# Patient Record
Sex: Male | Born: 1985 | Race: White | Hispanic: No | Marital: Single | Smoking: Current some day smoker
Health system: Southern US, Community
[De-identification: ages and names within clinical notes are randomized; demographics above are authoritative.]

## PROBLEM LIST (undated history)

## (undated) DIAGNOSIS — K429 Umbilical hernia without obstruction or gangrene: Secondary | ICD-10-CM

---

## 2011-09-19 ENCOUNTER — Encounter: Payer: Self-pay | Admitting: Internal Medicine

## 2011-10-05 ENCOUNTER — Ambulatory Visit: Payer: Self-pay | Admitting: Internal Medicine

## 2012-08-14 ENCOUNTER — Other Ambulatory Visit: Payer: Self-pay | Admitting: Gastroenterology

## 2012-08-14 DIAGNOSIS — R1033 Periumbilical pain: Secondary | ICD-10-CM

## 2012-08-14 DIAGNOSIS — K429 Umbilical hernia without obstruction or gangrene: Secondary | ICD-10-CM

## 2012-09-05 ENCOUNTER — Other Ambulatory Visit: Payer: Self-pay

## 2012-09-12 ENCOUNTER — Ambulatory Visit
Admission: RE | Admit: 2012-09-12 | Discharge: 2012-09-12 | Disposition: A | Discharge status: Home / Self Care | Payer: BC Managed Care – PPO | Source: Ambulatory Visit | Attending: Gastroenterology | Admitting: Gastroenterology

## 2012-09-12 DIAGNOSIS — R1033 Periumbilical pain: Secondary | ICD-10-CM

## 2012-09-12 DIAGNOSIS — K429 Umbilical hernia without obstruction or gangrene: Secondary | ICD-10-CM

## 2012-09-12 MED ORDER — IOHEXOL 300 MG/ML  SOLN
100.0000 mL | Freq: Once | INTRAMUSCULAR | Status: AC | PRN
  Administered 2012-09-12: 100 mL via INTRAVENOUS

## 2012-09-23 ENCOUNTER — Emergency Department (HOSPITAL_COMMUNITY): Payer: BC Managed Care – PPO

## 2012-09-23 ENCOUNTER — Emergency Department (HOSPITAL_COMMUNITY)
Admission: EM | Admit: 2012-09-23 | Discharge: 2012-09-23 | Disposition: A | Discharge status: Home / Self Care | Payer: BC Managed Care – PPO | Attending: Emergency Medicine | Admitting: Emergency Medicine

## 2012-09-23 ENCOUNTER — Encounter (HOSPITAL_COMMUNITY): Payer: Self-pay | Admitting: Family Medicine

## 2012-09-23 DIAGNOSIS — F1210 Cannabis abuse, uncomplicated: Secondary | ICD-10-CM | POA: Insufficient documentation

## 2012-09-23 DIAGNOSIS — Z79899 Other long term (current) drug therapy: Secondary | ICD-10-CM | POA: Insufficient documentation

## 2012-09-23 DIAGNOSIS — K429 Umbilical hernia without obstruction or gangrene: Secondary | ICD-10-CM | POA: Insufficient documentation

## 2012-09-23 DIAGNOSIS — R109 Unspecified abdominal pain: Secondary | ICD-10-CM

## 2012-09-23 HISTORY — DX: Umbilical hernia without obstruction or gangrene: K42.9

## 2012-09-23 LAB — CBC WITH DIFFERENTIAL/PLATELET
Eosinophils Absolute: 0.2 10*3/uL (ref 0.0–0.7)
Eosinophils Relative: 2 % (ref 0–5)
HCT: 45.2 % (ref 39.0–52.0)
Lymphocytes Relative: 24 % (ref 12–46)
Lymphs Abs: 2.5 10*3/uL (ref 0.7–4.0)
MCH: 31.6 pg (ref 26.0–34.0)
MCV: 91.7 fL (ref 78.0–100.0)
Monocytes Absolute: 0.9 10*3/uL (ref 0.1–1.0)
RBC: 4.93 MIL/uL (ref 4.22–5.81)
WBC: 10.3 10*3/uL (ref 4.0–10.5)

## 2012-09-23 LAB — URINALYSIS, ROUTINE W REFLEX MICROSCOPIC
Glucose, UA: NEGATIVE mg/dL
Ketones, ur: 15 mg/dL — AB
Leukocytes, UA: NEGATIVE
Protein, ur: NEGATIVE mg/dL
Urobilinogen, UA: 0.2 mg/dL (ref 0.0–1.0)

## 2012-09-23 LAB — COMPREHENSIVE METABOLIC PANEL
BUN: 14 mg/dL (ref 6–23)
CO2: 25 mEq/L (ref 19–32)
Calcium: 9.6 mg/dL (ref 8.4–10.5)
Creatinine, Ser: 0.98 mg/dL (ref 0.50–1.35)
GFR calc Af Amer: >90 mL/min (ref >90)
GFR calc non Af Amer: >90 mL/min (ref >90)
Glucose, Bld: 89 mg/dL (ref 70–99)

## 2012-09-23 LAB — LIPASE, BLOOD: Lipase: 27 U/L (ref 11–59)

## 2012-09-23 LAB — AMYLASE: Amylase: 52 U/L (ref 0–105)

## 2012-09-23 MED ORDER — HYDROCODONE-ACETAMINOPHEN 5-325 MG PO TABS: 1.0000 | ORAL_TABLET | ORAL | Status: DC | PRN

## 2012-09-23 MED ORDER — MORPHINE SULFATE 4 MG/ML IJ SOLN
4.0000 mg | Freq: Once | INTRAMUSCULAR | Status: AC
  Administered 2012-09-23: 4 mg via INTRAVENOUS
  Filled 2012-09-23: qty 1

## 2012-09-23 MED ORDER — SODIUM CHLORIDE 0.9 % IV SOLN
Freq: Once | INTRAVENOUS | Status: AC
  Administered 2012-09-23: 21:00:00 via INTRAVENOUS

## 2012-09-23 MED ORDER — ONDANSETRON HCL 4 MG/2ML IJ SOLN
4.0000 mg | Freq: Once | INTRAMUSCULAR | Status: AC
  Administered 2012-09-23: 4 mg via INTRAVENOUS
  Filled 2012-09-23: qty 2

## 2012-09-23 MED ORDER — METOCLOPRAMIDE HCL 5 MG/ML IJ SOLN
10.0000 mg | Freq: Once | INTRAMUSCULAR | Status: AC
  Administered 2012-09-23: 10 mg via INTRAVENOUS
  Filled 2012-09-23: qty 2

## 2012-09-23 NOTE — ED Provider Notes (Signed)
Medical screening examination/treatment/procedure(s) were conducted as a shared visit with non-physician practitioner(s) and myself.  I personally evaluated the patient during the encounter  Pt without signs of umbilical hernia incarceration. Patient abdomen is nonsurgical at this time. He stable for discharge  Toy Baker, MD 09/23/12 2225

## 2012-09-23 NOTE — ED Provider Notes (Signed)
History     CSN: [id number]  Arrival date & time 09/23/12  1754   First MD Initiated Contact with Patient 09/23/12 2012      Chief Complaint  Patient presents with  . Abdominal Pain   HPI  History provided by the patient and father. Patient is a 27 year old male with no significant PMH who presents with complaints of acute onset sharp stabbing pain to the umbilical area. Symptoms began around 4 PM towards end of his work day. 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 Patient does report prior history of chronic abdominal cramping, bloating and discomforts. Patient's sharp pain today is much different and more severe. It has been constant and unchanged. It is not worse with movements or position. Patient has not used any other treatments for his symptoms. Patient has been recently worked up for his chronic bloating and abdominal discomforts. He was seen by GI specialist, Dr. Loreta Ave reports she had several blood tests to rule out Crohn's disease, celiac disease and other possible causes. Patient also underwent a CT scan of his abdomen and pelvis on January 10 that was normal. Patient does state that he was told he may have a small umbilical hernia which is where his pain is coming from. He denies any diarrhea constipation today. Denies any blood in stool. Denies any nausea vomiting. Denies any fever, chills or sweats.    Past Medical History  Diagnosis Date  . Hernia, umbilical     No past surgical history on file.  No family history on file.  History  Substance Use Topics  . Smoking status: Never Smoker   . Smokeless tobacco: Not on file  . Alcohol Use: 12.0 oz/week    20 Cans of beer per week      Review of Systems  Constitutional: Negative for fever, chills and appetite change.  Gastrointestinal: Positive for abdominal pain. Negative for nausea, vomiting, diarrhea, constipation, blood in stool and anal bleeding.  Genitourinary: Negative for dysuria,  frequency, hematuria and flank pain.  All other systems reviewed and are negative.    Allergies  Review of patient's allergies indicates no known allergies.  Home Medications   Current Outpatient Rx  Name  Route  Sig  Dispense  Refill  . ACYCLOVIR 800 MG PO TABS   Oral   Take 800 mg by mouth daily.           BP 126/88  Pulse 97  Temp 98.4 F (36.9 C) (Oral)  Resp 18  SpO2 100%  Physical Exam  Nursing note and vitals reviewed. Constitutional: He is oriented to person, place, and time. He appears well-developed and well-nourished. No distress.  HENT:  Head: Normocephalic.  Cardiovascular: Normal rate and regular rhythm.   Pulmonary/Chest: Effort normal and breath sounds normal. No respiratory distress. He has no wheezes.  Abdominal: Soft. There is no rebound, no guarding, no tenderness at McBurney's point and negative Murphy's sign. A hernia is present.       Small reducible umbilical hernia  Neurological: He is alert and oriented to person, place, and time.  Skin: Skin is warm.  Psychiatric: He has a normal mood and affect. His behavior is normal.    ED Course  Procedures   Results for orders placed during the hospital encounter of 09/23/12  CBC WITH DIFFERENTIAL      Component Value Range   WBC 10.3  4.0 - 10.5 K/uL   RBC 4.93  4.22 - 5.81 MIL/uL  Hemoglobin 15.6  13.0 - 17.0 g/dL   HCT 40.9  81.1 - 91.4 %   MCV 91.7  78.0 - 100.0 fL   MCH 31.6  26.0 - 34.0 pg   MCHC 34.5  30.0 - 36.0 g/dL   RDW 78.2  95.6 - 21.3 %   Platelets 255  150 - 400 K/uL   Neutrophils Relative 65  43 - 77 %   Neutro Abs 6.7  1.7 - 7.7 K/uL   Lymphocytes Relative 24  12 - 46 %   Lymphs Abs 2.5  0.7 - 4.0 K/uL   Monocytes Relative 8  3 - 12 %   Monocytes Absolute 0.9  0.1 - 1.0 K/uL   Eosinophils Relative 2  0 - 5 %   Eosinophils Absolute 0.2  0.0 - 0.7 K/uL   Basophils Relative 1  0 - 1 %   Basophils Absolute 0.1  0.0 - 0.1 K/uL  COMPREHENSIVE METABOLIC PANEL      Component  Value Range   Sodium 137  135 - 145 mEq/L   Potassium 3.6  3.5 - 5.1 mEq/L   Chloride 101  96 - 112 mEq/L   CO2 25  19 - 32 mEq/L   Glucose, Bld 89  70 - 99 mg/dL   BUN 14  6 - 23 mg/dL   Creatinine, Ser 0.86  0.50 - 1.35 mg/dL   Calcium 9.6  8.4 - 57.8 mg/dL   Total Protein 7.7  6.0 - 8.3 g/dL   Albumin 4.4  3.5 - 5.2 g/dL   AST 12  0 - 37 U/L   ALT 15  0 - 53 U/L   Alkaline Phosphatase 43  39 - 117 U/L   Total Bilirubin 1.0  0.3 - 1.2 mg/dL   GFR calc non Af Amer >90  >90 mL/min   GFR calc Af Amer >90  >90 mL/min  AMYLASE      Component Value Range   Amylase 52  0 - 105 U/L  LIPASE, BLOOD      Component Value Range   Lipase 27  11 - 59 U/L  URINALYSIS, ROUTINE W REFLEX MICROSCOPIC      Component Value Range   Color, Urine YELLOW  YELLOW   APPearance CLEAR  CLEAR   Specific Gravity, Urine 1.020  1.005 - 1.030   pH 6.0  5.0 - 8.0   Glucose, UA NEGATIVE  NEGATIVE mg/dL   Hgb urine dipstick NEGATIVE  NEGATIVE   Bilirubin Urine NEGATIVE  NEGATIVE   Ketones, ur 15 (*) NEGATIVE mg/dL   Protein, ur NEGATIVE  NEGATIVE mg/dL   Urobilinogen, UA 0.2  0.0 - 1.0 mg/dL   Nitrite NEGATIVE  NEGATIVE   Leukocytes, UA NEGATIVE  NEGATIVE       Dg Abd Acute W/chest  09/23/2012  *RADIOLOGY REPORT*  Clinical Data: 27 year old male with abdominal pain.  ACUTE ABDOMEN SERIES (ABDOMEN 2 VIEW & CHEST 1 VIEW)  Comparison: 09/12/2012 CT  Findings: The cardiomediastinal silhouette is unremarkable.  There is no evidence of airspace disease, pleural effusion or pneumothorax.  The bowel gas pattern is unremarkable. There is no evidence of bowel obstruction or pneumoperitoneum. No suspicious calcifications are identified. No acute or suspicious bony abnormalities are present.  IMPRESSION: No evidence of acute abnormality - unremarkable bowel gas pattern.   Original Report Authenticated By: Harmon Pier, M.D.      1. Abdominal pain   2. Umbilical hernia       MDM  8:50 PM patient seen and  evaluated. Patient claimed that appears comfortable in no acute distress.  Patient has a very small 1-2 cm wide reducible umbilical hernia. There is tenderness to this area. No other significant abdominal tenderness. No peritoneal signs.   Patient with some improvement after medications. Patient was also seen and evaluated by attending physician and agrees with plan. Patient will be given prescription for [facility] and general surgery referral.     Angus Seller, PA 09/24/12 [phone]

## 2012-09-23 NOTE — ED Notes (Signed)
Patient states he has had "stabbing" abdominal pain since 4pm. States he has an umbilical hernia. Had a CT scan on 09/12/12. States pain got extremely worse today. Has never had this type of pain. Denies vomiting and diarrhea; reports some nausea.

## 2012-09-23 NOTE — ED Notes (Signed)
PA at bedside.

## 2012-09-25 NOTE — ED Provider Notes (Signed)
Medical screening examination/treatment/procedure(s) were performed by non-physician practitioner and as supervising physician I was immediately available for consultation/collaboration.  Toy Baker, MD 09/25/12 1328

## 2012-10-02 ENCOUNTER — Telehealth (INDEPENDENT_AMBULATORY_CARE_PROVIDER_SITE_OTHER): Payer: Self-pay

## 2012-10-02 NOTE — Telephone Encounter (Signed)
Called pt to let him know that his appt for Feb 10 has been r/s to Fri, Timothy Rivers 31st at 310p per MM request.  He appreciated my call and said he will be in the office at 245p.

## 2012-10-03 ENCOUNTER — Encounter (INDEPENDENT_AMBULATORY_CARE_PROVIDER_SITE_OTHER): Payer: Self-pay | Admitting: Surgery

## 2012-10-03 ENCOUNTER — Ambulatory Visit (INDEPENDENT_AMBULATORY_CARE_PROVIDER_SITE_OTHER): Payer: BC Managed Care – PPO | Admitting: Surgery

## 2012-10-03 VITALS — BP 142/84 | HR 76 | Temp 98.8°F | Resp 16 | Ht 72.0 in | Wt 180.0 lb

## 2012-10-03 DIAGNOSIS — R109 Unspecified abdominal pain: Secondary | ICD-10-CM

## 2012-10-03 MED ORDER — HYDROCODONE-ACETAMINOPHEN 5-325 MG PO TABS: 1.0000 | ORAL_TABLET | ORAL | Status: DC | PRN

## 2012-10-03 NOTE — Patient Instructions (Signed)
Followup with [facility] as needed

## 2012-10-03 NOTE — Progress Notes (Signed)
Chief complaint is abdominal pain of several years duration.  Timothy Rivers is a 26 year old white male who is referred by Dr. Loreta Ave for evaluation of abdominal pain. This has been accentuated and he had a sharp pain in his periumbilical region and was thought to have a significant umbilical hernia. CT scan was done which failed to reveal any cause for his pain and showed no evidence of a umbilical hernia. However he does have a tiny little umbilical hernia that I can squeeze and reduced. I would not let this kidney from working out and would probably where a weight belt when lifting.  More importantly he has the abdominal pain he has been may be related to meals it may be doesn't. One thing that I question his his chronic use of acyclovir and whether this antiviral agents could be contributing to his GI upset either by impacting his motility or with some direct inflammatory impacting the small bowel.  For the present time I am not recommending surgery for his umbilical hernia.  He made need upper endoscopy to look at his stomach and to look for evidence of gastric outlet obstruction and do a study for H. Pylori. I would be happy seen again as needed.  I did at his request fill a  prescription for hydrocodone 5/325   #30.

## 2012-10-06 ENCOUNTER — Ambulatory Visit (INDEPENDENT_AMBULATORY_CARE_PROVIDER_SITE_OTHER): Payer: BC Managed Care – PPO | Admitting: General Surgery

## 2012-10-08 ENCOUNTER — Encounter (INDEPENDENT_AMBULATORY_CARE_PROVIDER_SITE_OTHER): Payer: Self-pay

## 2012-10-15 ENCOUNTER — Ambulatory Visit (INDEPENDENT_AMBULATORY_CARE_PROVIDER_SITE_OTHER): Payer: Self-pay | Admitting: Surgery

## 2012-10-21 ENCOUNTER — Other Ambulatory Visit: Payer: Self-pay | Admitting: Gastroenterology

## 2012-10-21 DIAGNOSIS — R109 Unspecified abdominal pain: Secondary | ICD-10-CM

## 2012-11-07 ENCOUNTER — Ambulatory Visit (HOSPITAL_COMMUNITY): Payer: BC Managed Care – PPO

## 2012-11-07 ENCOUNTER — Encounter (HOSPITAL_COMMUNITY): Payer: BC Managed Care – PPO

## 2012-11-21 ENCOUNTER — Encounter (HOSPITAL_COMMUNITY)
Admission: RE | Admit: 2012-11-21 | Discharge: 2012-11-21 | Disposition: A | Discharge status: Home / Self Care | Payer: BC Managed Care – PPO | Source: Ambulatory Visit | Attending: Gastroenterology | Admitting: Gastroenterology

## 2012-11-21 ENCOUNTER — Ambulatory Visit (HOSPITAL_COMMUNITY)
Admission: RE | Admit: 2012-11-21 | Discharge: 2012-11-21 | Disposition: A | Discharge status: Home / Self Care | Payer: BC Managed Care – PPO | Source: Ambulatory Visit | Attending: Gastroenterology | Admitting: Gastroenterology

## 2012-11-21 DIAGNOSIS — R109 Unspecified abdominal pain: Secondary | ICD-10-CM | POA: Insufficient documentation

## 2012-12-12 ENCOUNTER — Encounter (INDEPENDENT_AMBULATORY_CARE_PROVIDER_SITE_OTHER): Payer: Self-pay

## 2012-12-23 ENCOUNTER — Other Ambulatory Visit: Payer: Self-pay | Admitting: Gastroenterology

## 2012-12-23 DIAGNOSIS — R1033 Periumbilical pain: Secondary | ICD-10-CM

## 2013-01-01 ENCOUNTER — Encounter (HOSPITAL_COMMUNITY)
Admission: RE | Admit: 2013-01-01 | Discharge: 2013-01-01 | Disposition: A | Discharge status: Home / Self Care | Payer: BC Managed Care – PPO | Source: Ambulatory Visit | Attending: Gastroenterology | Admitting: Gastroenterology

## 2013-01-01 DIAGNOSIS — R1033 Periumbilical pain: Secondary | ICD-10-CM | POA: Insufficient documentation

## 2013-01-01 MED ORDER — TECHNETIUM TC 99M SULFUR COLLOID
2.0000 | Freq: Once | INTRAVENOUS | Status: AC | PRN
  Administered 2013-01-01: 2 via ORAL

## 2013-08-17 ENCOUNTER — Other Ambulatory Visit: Payer: Self-pay | Admitting: Internal Medicine

## 2013-10-15 ENCOUNTER — Ambulatory Visit (INDEPENDENT_AMBULATORY_CARE_PROVIDER_SITE_OTHER): Payer: BC Managed Care – PPO | Admitting: Internal Medicine

## 2013-10-15 ENCOUNTER — Encounter: Payer: Self-pay | Admitting: Internal Medicine

## 2013-10-15 VITALS — BP 134/78 | HR 80 | Temp 99.7°F | Resp 16 | Ht 72.25 in | Wt 194.2 lb

## 2013-10-15 DIAGNOSIS — Z1212 Encounter for screening for malignant neoplasm of rectum: Secondary | ICD-10-CM

## 2013-10-15 DIAGNOSIS — R740 Nonspecific elevation of levels of transaminase and lactic acid dehydrogenase [LDH]: Secondary | ICD-10-CM

## 2013-10-15 DIAGNOSIS — Z111 Encounter for screening for respiratory tuberculosis: Secondary | ICD-10-CM

## 2013-10-15 DIAGNOSIS — R7402 Elevation of levels of lactic acid dehydrogenase (LDH): Secondary | ICD-10-CM

## 2013-10-15 DIAGNOSIS — E559 Vitamin D deficiency, unspecified: Secondary | ICD-10-CM

## 2013-10-15 DIAGNOSIS — Z0000 Encounter for general adult medical examination without abnormal findings: Secondary | ICD-10-CM

## 2013-10-15 DIAGNOSIS — Z79899 Other long term (current) drug therapy: Secondary | ICD-10-CM

## 2013-10-15 DIAGNOSIS — Z113 Encounter for screening for infections with a predominantly sexual mode of transmission: Secondary | ICD-10-CM

## 2013-10-15 LAB — CBC WITH DIFFERENTIAL/PLATELET
BASOS PCT: 1 % (ref 0–1)
Basophils Absolute: 0.1 10*3/uL (ref 0.0–0.1)
EOS ABS: 0.2 10*3/uL (ref 0.0–0.7)
EOS PCT: 3 % (ref 0–5)
HCT: 44.9 % (ref 39.0–52.0)
Hemoglobin: 15.6 g/dL (ref 13.0–17.0)
LYMPHS ABS: 2.2 10*3/uL (ref 0.7–4.0)
Lymphocytes Relative: 33 % (ref 12–46)
MCH: 31.9 pg (ref 26.0–34.0)
MCHC: 34.7 g/dL (ref 30.0–36.0)
MCV: 91.8 fL (ref 78.0–100.0)
Monocytes Absolute: 0.6 10*3/uL (ref 0.1–1.0)
Monocytes Relative: 9 % (ref 3–12)
NEUTROS PCT: 54 % (ref 43–77)
Neutro Abs: 3.4 10*3/uL (ref 1.7–7.7)
PLATELETS: 249 10*3/uL (ref 150–400)
RBC: 4.89 MIL/uL (ref 4.22–5.81)
RDW: 13.4 % (ref 11.5–15.5)
WBC: 6.5 10*3/uL (ref 4.0–10.5)

## 2013-10-15 LAB — HEMOGLOBIN A1C
HEMOGLOBIN A1C: 5.2 % (ref <5.7)
MEAN PLASMA GLUCOSE: 103 mg/dL (ref <117)

## 2013-10-15 NOTE — Progress Notes (Signed)
Patient ID: Timothy Rivers, male   DOB: 01/22/1986, 28 y.o.   MRN: [id number]   Annual Screening Comprehensive Examination   This very nice 28 y.o.male presents for complete physical. On one occasion during an acute illness in 2009 , he did have an elevated BP of 140/94, but BP's since including today's have been WNL. Last year in 2014 he did have an extensive w/u by Dr Collene Mares with Neg(-) EGD , neg U/S, and CT scans and negative lab screening including Celiac/Gluten testing. He describes ongoing problems with post prandial Bloating and ? mild abdominal distension. Otherwise, patient has no major health issues or concerns.    Finally, patient has history of Vitamin D Deficiency with vitamin D of 37 in 2008.       Medication List   acyclovir 800 MG tablet  Commonly known as:  ZOVIRAX  Take 800 mg by mouth daily.     HYDROcodone-acetaminophen 5-325 MG per tablet  Commonly known as:  NORCO  Take 1 tablet by mouth every 4 (four) hours as needed for pain.     valACYclovir 1000 MG tablet  Commonly known as:  VALTREX  TAKE 1/2 TABLET DAILY        No Known Allergies  Past Medical History  Diagnosis Date  . Hernia, umbilical     No past surgical history on file.  Family History  Problem Relation Age of Onset  . Diabetes Maternal Aunt   . Diabetes Maternal Uncle   . Cancer Maternal Grandfather     History   Social History  . Marital Status: Single    Spouse Name: N/A    Number of Children: N/A  . Years of Education: N/A   Occupational History  . Not on file.   Social History Main Topics  . Smoking status: Never Smoker   . Smokeless tobacco: Never Used  . Alcohol Use: 3.0 oz/week    5 Cans of beer per week  . Drug Use: Yes    Special: Marijuana  . Sexual Activity: Yes   Other Topics Concern  . Not on file   Social History Narrative  . No narrative on file    ROS Constitutional: Denies fever, chills, weight loss/gain, headaches, insomnia, fatigue, night sweats, and  change in appetite. Eyes: Denies redness, blurred vision, diplopia, discharge, itchy, watery eyes.  ENT: Denies discharge, congestion, post nasal drip, epistaxis, sore throat, earache, hearing loss, dental pain, Tinnitus, Vertigo, Sinus pain, snoring.  Cardio: Denies chest pain, palpitations, irregular heartbeat, syncope, dyspnea, diaphoresis, orthopnea, PND, claudication, edema Respiratory: denies cough, dyspnea, DOE, pleurisy, hoarseness, laryngitis, wheezing.  Gastrointestinal: Denies dysphagia, heartburn, reflux, water brash, pain, cramps, nausea, vomiting, bloating, diarrhea, constipation, hematemesis, melena, hematochezia, jaundice, hemorrhoids Genitourinary: Denies dysuria, frequency, urgency, nocturia, hesitancy, discharge, hematuria, flank pain Breast: Breast lumps, nipple discharge, bleeding.  Musculoskeletal: Denies arthralgia, myalgia, stiffness, Jt. Swelling, pain, limp, and strain/sprain. Does report occasional LBP which he attributes to his chair at work. Skin: Denies puritis, rash, hives, warts, acne, eczema, changing in skin lesion Neuro: Weakness, tremor, incoordination, spasms, paresthesia, pain Psychiatric: Denies confusion, memory loss, sensory loss. Endocrine: Denies change in weight, skin, hair change, nocturia, and paresthesia, diabetic polys, visual blurring, hyper /hypo glycemic episodes.  Heme/Lymph: No excessive bleeding, bruising, enlarged lymph nodes.  BP: 134/78  Pulse: 80  Temp: 99.7 F (37.6 C)  Resp: 16   Estimated body mass index is 26.16 kg/(m^2) as calculated from the following:   Height as of this encounter: 6' 0.25" (1.835  m).   Weight as of this encounter: 194 lb 3.2 oz (88.089 kg).  Physical Exam General Appearance: Well nourished, in no apparent distress. Eyes: PERRLA, EOMs, conjunctiva no swelling or erythema, normal fundi and vessels. Sinuses: No frontal/maxillary tenderness ENT/Mouth: EACs patent / TMs  nl. Nares clear without erythema,  swelling, mucoid exudates. Oral hygiene is good. No erythema, swelling, or exudate. Tongue normal, non-obstructing. Tonsils not swollen or erythematous. Hearing normal.  Neck: Supple, thyroid normal. No bruits, nodes or JVD. Respiratory: Respiratory effort normal.  BS equal and clear bilateral without rales, rhonci, wheezing or stridor. Cardio: Heart sounds are normal with regular rate and rhythm and no murmurs, rubs or gallops. Peripheral pulses are normal and equal bilaterally without edema. No aortic or femoral bruits. Chest: symmetric with normal excursions and percussion. Breasts: Symmetric, without lumps, nipple discharge, retractions, or fibrocystic changes.  Abdomen: Flat, soft, with bowl sounds. Nontender, no guarding, rebound, hernias, masses, or organomegaly.  Lymphatics: Non tender without lymphadenopathy.  Genitourinary:  Musculoskeletal: Full ROM all peripheral extremities, joint stability, 5/5 strength, and normal gait. Skin: Warm and dry without rashes, lesions, cyanosis, clubbing or  ecchymosis.  Neuro: Cranial nerves intact, reflexes equal bilaterally. Normal muscle tone, no cerebellar symptoms. Sensation intact.  Pysch: Awake and oriented X 3, normal affect, Insight and Judgment appropriate.   Assessment and Plan  1. Annual Screening Examination 2. Probable IBS 3. Vitamin D Deficiency   Continue prudent diet as discussed, weight control, regular exercise, and medications. Routine screening labs and tests as requested with regular follow-up as recommended.

## 2013-10-16 LAB — HEPATIC FUNCTION PANEL
ALK PHOS: 44 U/L (ref 39–117)
ALT: 22 U/L (ref 0–53)
AST: 14 U/L (ref 0–37)
Albumin: 4.4 g/dL (ref 3.5–5.2)
BILIRUBIN DIRECT: 0.2 mg/dL (ref 0.0–0.3)
BILIRUBIN INDIRECT: 0.7 mg/dL (ref 0.2–1.2)
BILIRUBIN TOTAL: 0.9 mg/dL (ref 0.2–1.2)
Total Protein: 7.4 g/dL (ref 6.0–8.3)

## 2013-10-16 LAB — MAGNESIUM: Magnesium: 1.8 mg/dL (ref 1.5–2.5)

## 2013-10-16 LAB — LIPID PANEL
CHOLESTEROL: 137 mg/dL (ref 0–200)
HDL: 57 mg/dL (ref >39)
LDL Cholesterol: 70 mg/dL (ref 0–99)
TRIGLYCERIDES: 51 mg/dL (ref <150)
Total CHOL/HDL Ratio: 2.4 Ratio
VLDL: 10 mg/dL (ref 0–40)

## 2013-10-16 LAB — HEPATITIS B SURFACE ANTIBODY,QUALITATIVE: Hep B S Ab: NEGATIVE

## 2013-10-16 LAB — BASIC METABOLIC PANEL WITH GFR
BUN: 12 mg/dL (ref 6–23)
CALCIUM: 9.7 mg/dL (ref 8.4–10.5)
CO2: 29 meq/L (ref 19–32)
CREATININE: 0.84 mg/dL (ref 0.50–1.35)
Chloride: 102 mEq/L (ref 96–112)
GFR, Est Non African American: >89 mL/min
GLUCOSE: 54 mg/dL — AB (ref 70–99)
Potassium: 3.9 mEq/L (ref 3.5–5.3)
SODIUM: 141 meq/L (ref 135–145)

## 2013-10-16 LAB — HEPATITIS B CORE ANTIBODY, TOTAL: HEP B C TOTAL AB: NONREACTIVE

## 2013-10-16 LAB — RPR

## 2013-10-16 LAB — MICROALBUMIN / CREATININE URINE RATIO
Creatinine, Urine: 59.9 mg/dL
Microalb Creat Ratio: 8.3 mg/g (ref 0.0–30.0)
Microalb, Ur: 0.5 mg/dL (ref 0.00–1.89)

## 2013-10-16 LAB — URINALYSIS, MICROSCOPIC ONLY
BACTERIA UA: NONE SEEN
CASTS: NONE SEEN
CRYSTALS: NONE SEEN
SQUAMOUS EPITHELIAL / LPF: NONE SEEN

## 2013-10-16 LAB — HEPATITIS A ANTIBODY, TOTAL: HEP A TOTAL AB: NONREACTIVE

## 2013-10-16 LAB — HEPATITIS C ANTIBODY: HCV Ab: NEGATIVE

## 2013-10-16 LAB — TESTOSTERONE: Testosterone: 420 ng/dL (ref 300–890)

## 2013-10-16 LAB — HIV ANTIBODY (ROUTINE TESTING W REFLEX): HIV: NONREACTIVE

## 2013-10-16 LAB — VITAMIN B12: VITAMIN B 12: 316 pg/mL (ref 211–911)

## 2013-10-16 LAB — TSH: TSH: 1.264 u[IU]/mL (ref 0.350–4.500)

## 2013-10-16 LAB — INSULIN, FASTING: Insulin fasting, serum: 9 u[IU]/mL (ref 3–28)

## 2013-10-16 LAB — VITAMIN D 25 HYDROXY (VIT D DEFICIENCY, FRACTURES): VIT D 25 HYDROXY: 26 ng/mL — AB (ref 30–89)

## 2013-10-19 ENCOUNTER — Other Ambulatory Visit: Payer: Self-pay | Admitting: Emergency Medicine

## 2013-10-19 LAB — TB SKIN TEST
INDURATION: 0 mm
TB SKIN TEST: NEGATIVE

## 2013-10-19 LAB — HEPATITIS B E ANTIBODY: HEPATITIS BE ANTIBODY: NEGATIVE

## 2013-10-19 MED ORDER — VALACYCLOVIR HCL 1 G PO TABS: ORAL_TABLET | ORAL | Status: DC

## 2013-11-15 IMAGING — CR DG ABDOMEN ACUTE W/ 1V CHEST
4 series · 4 of 4 positions shown · non-contrast
Comparison: 09/12/2012 CT

CLINICAL DATA: 26-year-old male with abdominal pain.

ACUTE ABDOMEN SERIES (ABDOMEN 2 VIEW & CHEST 1 VIEW)

[w chest pa]
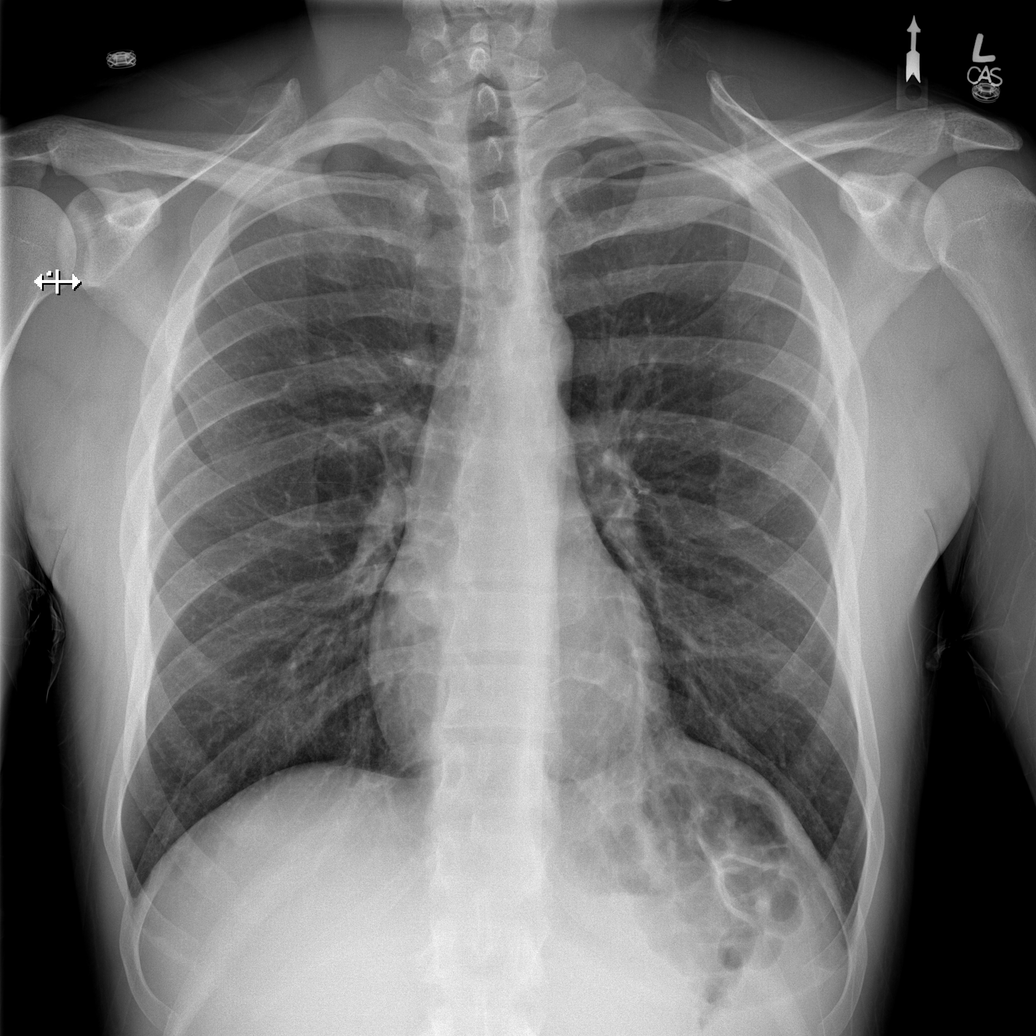

[w abdomen upright]
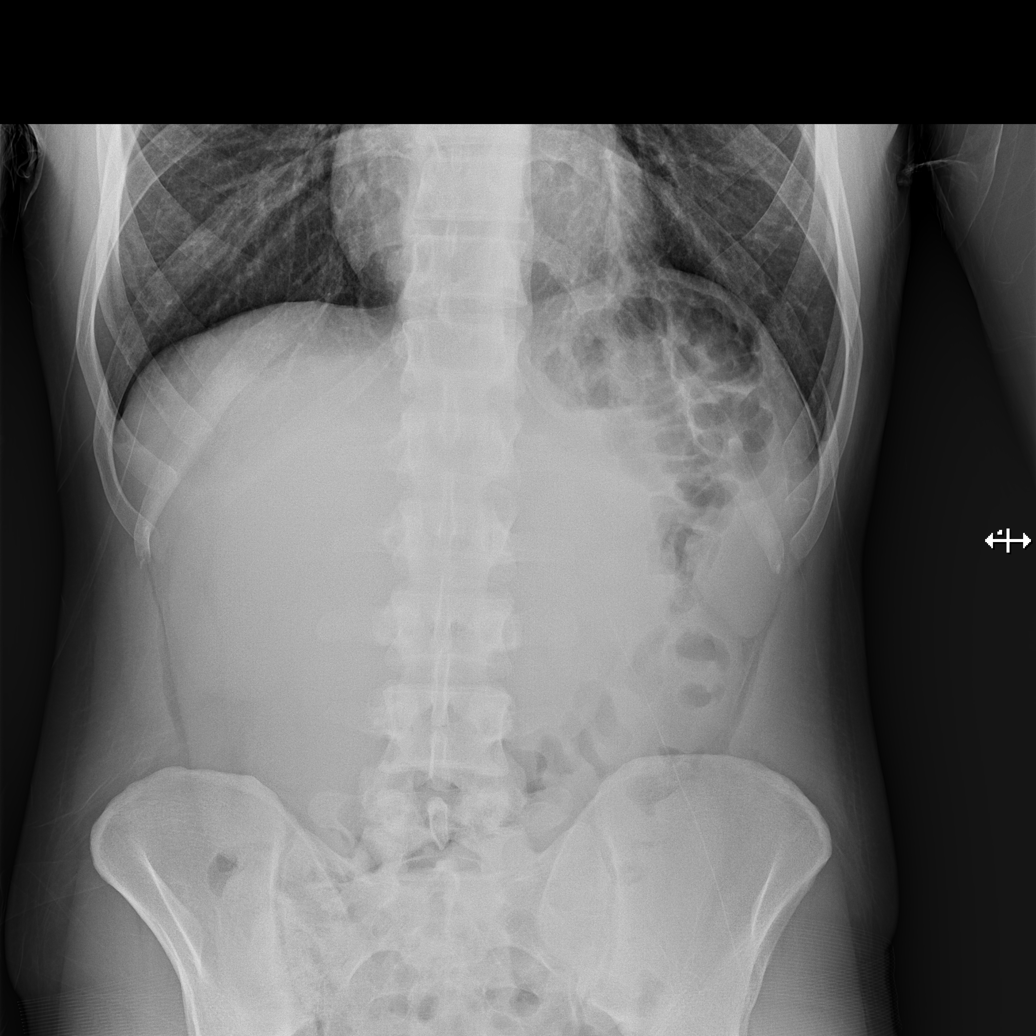

[t abdomen supine (1 of 2)]
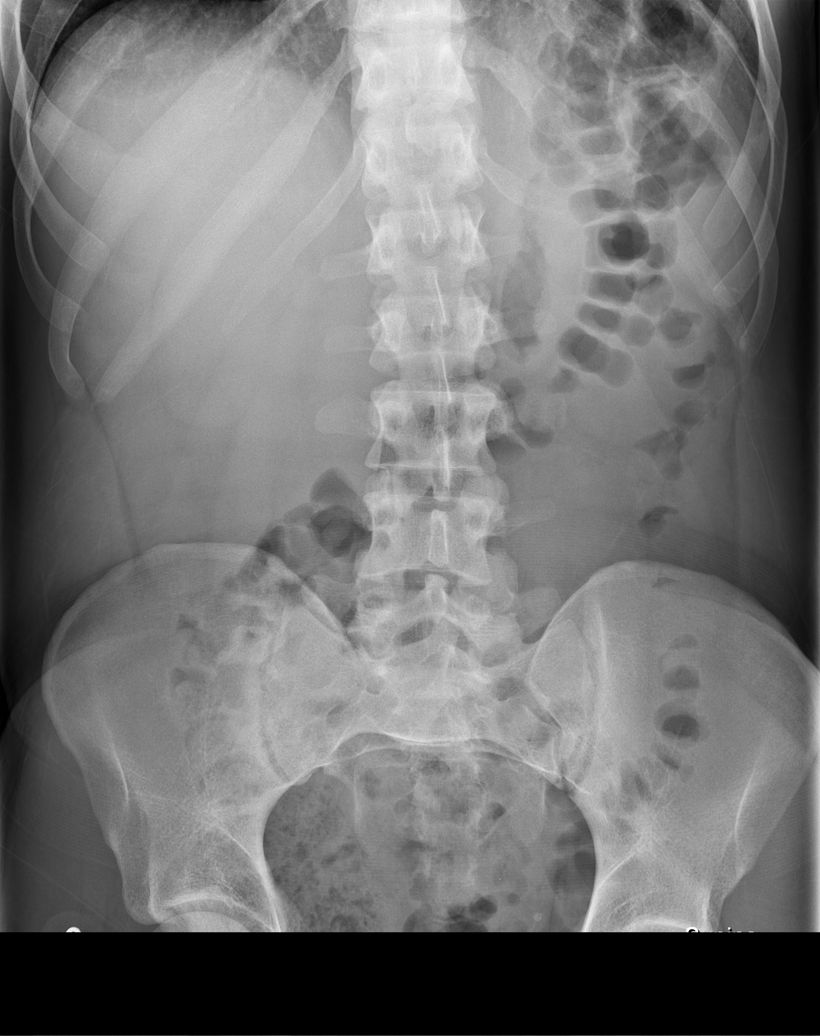

[t abdomen supine (2 of 2)]
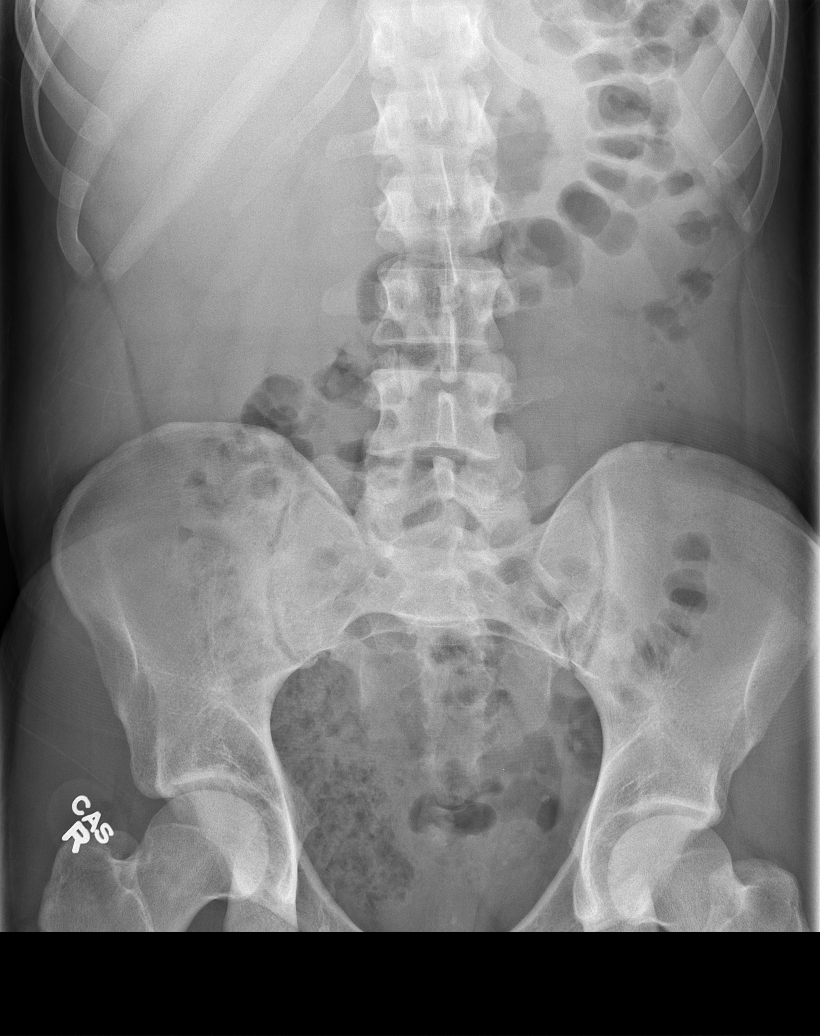

[4 of 4 positions shown; findings below may reference images not displayed]

FINDINGS: The cardiomediastinal silhouette is unremarkable.

There is no evidence of airspace disease, pleural effusion or
pneumothorax.

The bowel gas pattern is unremarkable.
There is no evidence of bowel obstruction or pneumoperitoneum.
No suspicious calcifications are identified.
No acute or suspicious bony abnormalities are present.
IMPRESSION: No evidence of acute abnormality - unremarkable bowel gas pattern.

## 2013-11-30 ENCOUNTER — Other Ambulatory Visit: Payer: Self-pay | Admitting: *Deleted

## 2013-11-30 MED ORDER — VALACYCLOVIR HCL 1 G PO TABS: ORAL_TABLET | ORAL | Status: DC

## 2014-10-18 ENCOUNTER — Encounter: Payer: Self-pay | Admitting: Internal Medicine

## 2014-10-20 ENCOUNTER — Encounter: Payer: Self-pay | Admitting: Internal Medicine

## 2014-10-20 ENCOUNTER — Ambulatory Visit (INDEPENDENT_AMBULATORY_CARE_PROVIDER_SITE_OTHER): Payer: BLUE CROSS/BLUE SHIELD | Admitting: Internal Medicine

## 2014-10-20 VITALS — BP 138/88 | HR 68 | Temp 98.6°F | Resp 18 | Ht 73.0 in | Wt 190.0 lb

## 2014-10-20 DIAGNOSIS — R030 Elevated blood-pressure reading, without diagnosis of hypertension: Secondary | ICD-10-CM

## 2014-10-20 DIAGNOSIS — IMO0001 Reserved for inherently not codable concepts without codable children: Secondary | ICD-10-CM

## 2014-10-20 DIAGNOSIS — Z1212 Encounter for screening for malignant neoplasm of rectum: Secondary | ICD-10-CM

## 2014-10-20 DIAGNOSIS — Z0000 Encounter for general adult medical examination without abnormal findings: Secondary | ICD-10-CM

## 2014-10-20 NOTE — Patient Instructions (Signed)
Recommend the book "The END of DIETING" by Dr Excell Seltzer   & the book "The END of DIABETES " by Dr Excell Seltzer  At [email or web address] - get book & Audio CD's      Being diabetic has a  300% increased risk for heart attack, stroke, cancer, and alzheimer- type vascular dementia. It is very important that you work harder with diet by avoiding all foods that are white. Avoid white rice (brown & wild rice is OK), white potatoes (sweetpotatoes in moderation is OK), White bread or wheat bread or anything made out of white flour like bagels, donuts, rolls, buns, biscuits, cakes, pastries, cookies, pizza crust, and pasta (made from white flour & egg whites) - vegetarian pasta or spinach or wheat pasta is OK. Multigrain breads like Arnold's or [employer], or multigrain sandwich thins or flatbreads.  Diet, exercise and weight loss can reverse and cure diabetes in the early stages.  Diet, exercise and weight loss is very important in the control and prevention of complications of diabetes which affects every system in your body, ie. Brain - dementia/stroke, eyes - glaucoma/blindness, heart - heart attack/heart failure, kidneys - dialysis, stomach - gastric paralysis, intestines - malabsorption, nerves - severe painful neuritis, circulation - gangrene & loss of a leg(s), and finally cancer and Alzheimers.    I recommend avoid fried & greasy foods,  sweets/candy, white rice (brown or wild rice or Quinoa is OK), white potatoes (sweet potatoes are OK) - anything made from white flour - bagels, doughnuts, rolls, buns, biscuits,white and wheat breads, pizza crust and traditional pasta made of white flour & egg white(vegetarian pasta or spinach or wheat pasta is OK).  Multi-grain bread is OK - like multi-grain flat bread or sandwich thins. Avoid alcohol in excess. Exercise is also important.    Eat all the vegetables you want - avoid meat, especially red meat and dairy - especially cheese.  Cheese is the most  concentrated form of trans-fats which is the worst thing to clog up our arteries. Veggie cheese is OK which can be found in the fresh produce section at [employer] or [employer] or [employer]  Preventive Care for Adults A healthy lifestyle and preventive care can promote health and wellness. Preventive health guidelines for men include the following key practices:  A routine yearly physical is a good way to check with your health care provider about your health and preventative screening. It is a chance to share any concerns and updates on your health and to receive a thorough exam.  Visit your dentist for a routine exam and preventative care every 6 months. Brush your teeth twice a day and floss once a day. Good oral hygiene prevents tooth decay and gum disease.  The frequency of eye exams is based on your age, health, family medical history, use of contact lenses, and other factors. Follow your health care provider's recommendations for frequency of eye exams.  Eat a healthy diet. Foods such as vegetables, fruits, whole grains, low-fat dairy products, and lean protein foods contain the nutrients you need without too many calories. Decrease your intake of foods high in solid fats, added sugars, and salt. Eat the right amount of calories for you.Get information about a proper diet from your health care provider, if necessary.  Regular physical exercise is one of the most important things you can do for your health. Most adults should get at least 150 minutes of moderate-intensity exercise (any activity that increases your heart rate  and causes you to sweat) each week. In addition, most adults need muscle-strengthening exercises on 2 or more days a week.  Maintain a healthy weight. The body mass index (BMI) is a screening tool to identify possible weight problems. It provides an estimate of body fat based on height and weight. Your health care provider can find your BMI and can help you achieve or  maintain a healthy weight.For adults 20 years and older:  A BMI below 18.5 is considered underweight.  A BMI of 18.5 to 24.9 is normal.  A BMI of 25 to 29.9 is considered overweight.  A BMI of 30 and above is considered obese.  Maintain normal blood lipids and cholesterol levels by exercising and minimizing your intake of saturated fat. Eat a balanced diet with plenty of fruit and vegetables. Blood tests for lipids and cholesterol should begin at age 9 and be repeated every 5 years. If your lipid or cholesterol levels are high, you are over 50, or you are at high risk for heart disease, you may need your cholesterol levels checked more frequently.Ongoing high lipid and cholesterol levels should be treated with medicines if diet and exercise are not working.  If you smoke, find out from your health care provider how to quit. If you do not use tobacco, do not start.  Lung cancer screening is recommended for adults aged 80-80 years who are at high risk for developing lung cancer because of a history of smoking. A yearly low-dose CT scan of the lungs is recommended for people who have at least a 30-pack-year history of smoking and are a current smoker or have quit within the past 15 years. A pack year of smoking is smoking an average of 1 pack of cigarettes a day for 1 year (for example: 1 pack a day for 30 years or 2 packs a day for 15 years). Yearly screening should continue until the smoker has stopped smoking for at least 15 years. Yearly screening should be stopped for people who develop a health problem that would prevent them from having lung cancer treatment.  If you choose to drink alcohol, do not have more than 2 drinks per day. One drink is considered to be 12 ounces (355 mL) of beer, 5 ounces (148 mL) of wine, or 1.5 ounces (44 mL) of liquor.  High blood pressure causes heart disease and increases the risk of stroke. Your blood pressure should be checked. Ongoing high blood pressure  should be treated with medicines, if weight loss and exercise are not effective.  If you are 8-43 years old, ask your health care provider if you should take aspirin to prevent heart disease.  Diabetes screening involves taking a blood sample to check your fasting blood sugar level. Testing should be considered at a younger age or be carried out more frequently if you are overweight and have at least 1 risk factor for diabetes.  Colorectal cancer can be detected and often prevented. Most routine colorectal cancer screening begins at the age of 89 and continues through age 34. However, your health care provider may recommend screening at an earlier age if you have risk factors for colon cancer. On a yearly basis, your health care provider may provide home test kits to check for hidden blood in the stool. Use of a small camera at the end of a tube to directly examine the colon (sigmoidoscopy or colonoscopy) can detect the earliest forms of colorectal cancer. Talk to your health care provider about  this at age 50, when routine screening begins. Direct exam of the colon should be repeated every 5-10 years through age 75, unless early forms of precancerous polyps or small growths are found.  Screening for abdominal aortic aneurysm (AAA)  are recommended for persons over age 50 who have history of hypertensionor who are current or former smokers.  Talk with your health care provider about prostate cancer screening.  Testicular cancer screening is recommended for adult males. Screening includes self-exam, a health care provider exam, and other screening tests. Consult with your health care provider about any symptoms you have or any concerns you have about testicular cancer.  Use sunscreen. Apply sunscreen liberally and repeatedly throughout the day. You should seek shade when your shadow is shorter than you. Protect yourself by wearing long sleeves, pants, a wide-brimmed hat, and sunglasses year round,  whenever you are outdoors.  Once a month, do a whole-body skin exam, using a mirror to look at the skin on your back. Tell your health care provider about new moles, moles that have irregular borders, moles that are larger than a pencil eraser, or moles that have changed in shape or color.  Stay current with required vaccines (immunizations).  Influenza vaccine. All adults should be immunized every year.  Tetanus, diphtheria, and acellular pertussis (Td, Tdap) vaccine. An adult who has not previously received Tdap or who does not know his vaccine status should receive 1 dose of Tdap. This initial dose should be followed by tetanus and diphtheria toxoids (Td) booster doses every 10 years. Adults with an unknown or incomplete history of completing a 3-dose immunization series with Td-containing vaccines should begin or complete a primary immunization series including a Tdap dose. Adults should receive a Td booster every 10 years.  Zoster vaccine. One dose is recommended for adults aged 60 years or older unless certain conditions are present.    Pneumococcal 13-valent conjugate (PCV13) vaccine. When indicated, a person who is uncertain of his immunization history and has no record of immunization should receive the PCV13 vaccine. An adult aged 19 years or older who has certain medical conditions and has not been previously immunized should receive 1 dose of PCV13 vaccine. This PCV13 should be followed with a dose of pneumococcal polysaccharide (PPSV23) vaccine. The PPSV23 vaccine dose should be obtained at least 8 weeks after the dose of PCV13 vaccine. An adult aged 19 years or older who has certain medical conditions and previously received 1 or more doses of PPSV23 vaccine should receive 1 dose of PCV13. The PCV13 vaccine dose should be obtained 1 or more years after the last PPSV23 vaccine dose.    Pneumococcal polysaccharide (PPSV23) vaccine. When PCV13 is also indicated, PCV13 should be obtained  first. All adults aged 65 years and older should be immunized. An adult younger than age 65 years who has certain medical conditions should be immunized. Any person who resides in a nursing home or long-term care facility should be immunized. An adult smoker should be immunized. People with an immunocompromised condition and certain other conditions should receive both PCV13 and PPSV23 vaccines. People with human immunodeficiency virus (HIV) infection should be immunized as soon as possible after diagnosis. Immunization during chemotherapy or radiation therapy should be avoided. Routine use of PPSV23 vaccine is not recommended for American Indians, [place] Natives, or people younger than 65 years unless there are medical conditions that require PPSV23 vaccine. When indicated, people who have unknown immunization and have no record of immunization should receive   PPSV23 vaccine. One-time revaccination 5 years after the first dose of PPSV23 is recommended for people aged 19-64 years who have chronic kidney failure, nephrotic syndrome, asplenia, or immunocompromised conditions. People who received 1-2 doses of PPSV23 before age 22 years should receive another dose of PPSV23 vaccine at age 42 years or later if at least 5 years have passed since the previous dose. Doses of PPSV23 are not needed for people immunized with PPSV23 at or after age 41 years.  Hepatitis A vaccine. Adults who wish to be protected from this disease, have certain high-risk conditions, work with hepatitis A-infected animals, work in hepatitis A research labs, or travel to or work in countries with a high rate of hepatitis A should be immunized. Adults who were previously unvaccinated and who anticipate close contact with an international adoptee during the first 60 days after arrival in the [place] from a country with a high rate of hepatitis A should be immunized.  Hepatitis B vaccine. Adults should be immunized if they wish to be  protected from this disease, have certain high-risk conditions, may be exposed to blood or other infectious body fluids, are household contacts or sex partners of hepatitis B positive people, are clients or workers in certain care facilities, or travel to or work in countries with a high rate of hepatitis B.  Preventive Service / Frequency  Ages 1 to 74  Blood pressure check.  Lipid and cholesterol check.  Hepatitis C blood test.** / For any individual with known risks for hepatitis C.  Skin self-exam. / Monthly.  Influenza vaccine. / Every year.  Tetanus, diphtheria, and acellular pertussis (Tdap, Td) vaccine.** / Consult your health care provider. 1 dose of Td every 10 years.  HPV vaccine. / 3 doses over 6 months, if 32 or younger.  Measles, mumps, rubella (MMR) vaccine.** / You need at least 1 dose of MMR if you were born in 1957 or later. You may also need a second dose.  Pneumococcal 13-valent conjugate (PCV13) vaccine.** / Consult your health care provider.  Pneumococcal polysaccharide (PPSV23) vaccine.** / 1 to 2 doses if you smoke cigarettes or if you have certain conditions.  Meningococcal vaccine.** / 1 dose if you are age 5 to 6 years and a Market researcher living in a residence hall, or have one of several medical conditions. You may also need additional booster doses.  Hepatitis A vaccine.** / Consult your health care provider.  Hepatitis B vaccine.** / Consult your health care provider.

## 2014-10-20 NOTE — Progress Notes (Signed)
Patient ID: Timothy Rivers, male   DOB: 19-Apr-1986, 29 y.o.   MRN: [id number]  Annual Screening Comprehensive Examination   This very nice 29 y.o. Single WM presents for complete physical.  Patient has no major health issues. In 2009 , he did have an elevated BP 140/94 and again in nov 2011 an elevated BP of 140/76 and has been monitored ed expectantly.     In 2014, he had a Neg EGD, Abd U/S, Abd CTscan, & neg celiac screening by Dr Collene Mares. He denies any recent GI sx's.    Finally, patient has history of Vitamin D Deficiency and last vitamin D was 26 in Feb 2015   Medication Sig  . valACYclovir (VALTREX) 1000 MG tablet TAKE 1/2 TABLET DAILY   No Known Allergies  Past Medical History  Diagnosis Date  . Hernia, umbilical    Immunization History  Administered Date(s) Administered  . PPD Test - negative 10/15/2013   No past surgical history on file.  Family History  Problem Relation Age of Onset  . Diabetes Maternal Aunt   . Diabetes Maternal Uncle   . Cancer Maternal Grandfather    History   Social History  . Marital Status: Single - plans to marry in the next year.    Spouse Name: N/A  . Number of Children: N/A  . Years of Education: N/A   Occupational History  . 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   Social History Main Topics  . Smoking status: Never Smoker   . Smokeless tobacco: Never Used  . Alcohol Use: 3.0 oz/week    5 Cans of beer per week  . Drug Use: Yes    Special: Marijuana  . Sexual Activity: Yes     ROS Constitutional: Denies fever, chills, weight loss/gain, headaches, insomnia, fatigue, night sweats, and change in appetite. Eyes: Denies redness, blurred vision, diplopia, discharge, itchy, watery eyes.  ENT: Denies discharge, congestion, post nasal drip, epistaxis, sore throat, earache, hearing loss, dental pain, Tinnitus, Vertigo, Sinus pain, snoring.  Cardio: Denies chest pain, palpitations, irregular heartbeat, syncope,  dyspnea, diaphoresis, orthopnea, PND, claudication, edema Respiratory: denies cough, dyspnea, DOE, pleurisy, hoarseness, laryngitis, wheezing.  Gastrointestinal: Denies dysphagia, heartburn, reflux, water brash, pain, cramps, nausea, vomiting, bloating, diarrhea, constipation, hematemesis, melena, hematochezia, jaundice, hemorrhoids Genitourinary: Denies dysuria, frequency, urgency, nocturia, hesitancy, discharge, hematuria, flank pain Breast: Breast lumps, nipple discharge, bleeding.  Musculoskeletal: Denies arthralgia, myalgia, stiffness, Jt. Swelling, pain, limp, and strain/sprain. Skin: Denies puritis, rash, hives, warts, acne, eczema, changing in skin lesion Neuro: Weakness, tremor, incoordination, spasms, paresthesia, pain Psychiatric: Denies confusion, memory loss, sensory loss. Endocrine: Denies change in weight, skin, hair change, nocturia, and paresthesia, diabetic polys, visual blurring, hyper /hypo glycemic episodes.  Heme/Lymph: No excessive bleeding, bruising, enlarged lymph nodes.   Physical Exam  BP 138/88 mmHg  Pulse 68  Temp(Src) 98.6 F (37 C) (Temporal)  Resp 18  Ht 6\' 1"  (1.854 m)  Wt 190 lb (86.183 kg)  BMI 25.07 kg/m2  General Appearance: Well nourished and in no apparent distress. Eyes: PERRLA, EOMs, conjunctiva no swelling or erythema, normal fundi and vessels. Sinuses: No frontal/maxillary tenderness ENT/Mouth: EACs patent / TMs  nl. Nares clear without erythema, swelling, mucoid exudates. Oral hygiene is good. No erythema, swelling, or exudate. Tongue normal, non-obstructing. Tonsils not swollen or erythematous. Hearing normal.  Neck: Supple, thyroid normal. No bruits, nodes or JVD. Respiratory: Respiratory effort normal.  BS equal and clear bilateral without rales, rhonci, wheezing or stridor. Cardio:  Heart sounds are normal with regular rate and rhythm and no murmurs, rubs or gallops. Peripheral pulses are normal and equal bilaterally without edema. No  aortic or femoral bruits. Chest: symmetric with normal excursions and percussion. Abdomen: Flat, soft, with bowl sounds. Nontender, no guarding, rebound, hernias, masses, or organomegaly.  Lymphatics: Non tender without lymphadenopathy.  Genitourinary: Nl Male - No hernia. Testes unremarkable. Musculoskeletal: Full ROM all peripheral extremities, joint stability, 5/5 strength, and normal gait. Skin: Warm and dry without rashes, lesions, cyanosis, clubbing or  ecchymosis.  Neuro: Cranial nerves intact, reflexes equal bilaterally. Normal muscle tone, no cerebellar symptoms. Sensation intact.  Pysch: Awake and oriented X 3, normal affect, Insight and Judgment appropriate.   Assessment and Plan  1. Annual Screening Examination 2. Hx/o labile HTN 3.  Vit D Deficiency   Continue prudent diet as discussed, weight control, regular exercise, and medications. Routine screening labs and tests as requested with regular follow-up as recommended.

## 2014-10-21 LAB — CBC WITH DIFFERENTIAL/PLATELET
BASOS PCT: 1 % (ref 0–1)
Basophils Absolute: 0.1 10*3/uL (ref 0.0–0.1)
EOS ABS: 0.2 10*3/uL (ref 0.0–0.7)
Eosinophils Relative: 3 % (ref 0–5)
HCT: 44.8 % (ref 39.0–52.0)
Hemoglobin: 15.5 g/dL (ref 13.0–17.0)
Lymphocytes Relative: 25 % (ref 12–46)
Lymphs Abs: 1.9 10*3/uL (ref 0.7–4.0)
MCH: 31.4 pg (ref 26.0–34.0)
MCHC: 34.6 g/dL (ref 30.0–36.0)
MCV: 90.9 fL (ref 78.0–100.0)
MONO ABS: 0.8 10*3/uL (ref 0.1–1.0)
MPV: 11.3 fL (ref 8.6–12.4)
Monocytes Relative: 11 % (ref 3–12)
NEUTROS PCT: 60 % (ref 43–77)
Neutro Abs: 4.6 10*3/uL (ref 1.7–7.7)
Platelets: 247 10*3/uL (ref 150–400)
RBC: 4.93 MIL/uL (ref 4.22–5.81)
RDW: 13.5 % (ref 11.5–15.5)
WBC: 7.7 10*3/uL (ref 4.0–10.5)

## 2014-10-21 LAB — HEPATIC FUNCTION PANEL
ALT: 43 U/L (ref 0–53)
AST: 15 U/L (ref 0–37)
Albumin: 4.2 g/dL (ref 3.5–5.2)
Alkaline Phosphatase: 49 U/L (ref 39–117)
BILIRUBIN DIRECT: 0.2 mg/dL (ref 0.0–0.3)
BILIRUBIN TOTAL: 0.6 mg/dL (ref 0.2–1.2)
Indirect Bilirubin: 0.4 mg/dL (ref 0.2–1.2)
Total Protein: 6.9 g/dL (ref 6.0–8.3)

## 2014-10-21 LAB — BASIC METABOLIC PANEL WITH GFR
BUN: 12 mg/dL (ref 6–23)
CALCIUM: 9.5 mg/dL (ref 8.4–10.5)
CO2: 30 mEq/L (ref 19–32)
CREATININE: 0.76 mg/dL (ref 0.50–1.35)
Chloride: 101 mEq/L (ref 96–112)
Glucose, Bld: 85 mg/dL (ref 70–99)
Potassium: 3.9 mEq/L (ref 3.5–5.3)
Sodium: 138 mEq/L (ref 135–145)

## 2014-10-21 LAB — URINALYSIS, MICROSCOPIC ONLY
Bacteria, UA: NONE SEEN
CASTS: NONE SEEN
Crystals: NONE SEEN
SQUAMOUS EPITHELIAL / LPF: NONE SEEN

## 2014-12-27 ENCOUNTER — Other Ambulatory Visit: Payer: Self-pay | Admitting: *Deleted

## 2014-12-27 MED ORDER — VALACYCLOVIR HCL 1 G PO TABS: ORAL_TABLET | ORAL | Status: DC

## 2015-08-30 ENCOUNTER — Other Ambulatory Visit: Payer: Self-pay | Admitting: Internal Medicine

## 2015-08-30 DIAGNOSIS — K1379 Other lesions of oral mucosa: Secondary | ICD-10-CM

## 2015-08-30 MED ORDER — VALACYCLOVIR HCL 1 G PO TABS: ORAL_TABLET | ORAL | Status: DC

## 2015-11-03 ENCOUNTER — Encounter: Payer: Self-pay | Admitting: Internal Medicine

## 2015-11-07 ENCOUNTER — Encounter: Payer: Self-pay | Admitting: Internal Medicine

## 2016-02-01 ENCOUNTER — Encounter: Payer: Self-pay | Admitting: Internal Medicine

## 2016-02-01 ENCOUNTER — Ambulatory Visit (INDEPENDENT_AMBULATORY_CARE_PROVIDER_SITE_OTHER): Payer: BLUE CROSS/BLUE SHIELD | Admitting: Internal Medicine

## 2016-02-01 VITALS — BP 128/84 | HR 80 | Temp 97.9°F | Resp 16 | Ht 72.0 in | Wt 197.8 lb

## 2016-02-01 DIAGNOSIS — R7309 Other abnormal glucose: Secondary | ICD-10-CM | POA: Insufficient documentation

## 2016-02-01 DIAGNOSIS — R5383 Other fatigue: Secondary | ICD-10-CM

## 2016-02-01 DIAGNOSIS — IMO0001 Reserved for inherently not codable concepts without codable children: Secondary | ICD-10-CM

## 2016-02-01 DIAGNOSIS — E7889 Other lipoprotein metabolism disorders: Secondary | ICD-10-CM | POA: Insufficient documentation

## 2016-02-01 DIAGNOSIS — E559 Vitamin D deficiency, unspecified: Secondary | ICD-10-CM | POA: Diagnosis not present

## 2016-02-01 DIAGNOSIS — Z0000 Encounter for general adult medical examination without abnormal findings: Secondary | ICD-10-CM

## 2016-02-01 DIAGNOSIS — Z111 Encounter for screening for respiratory tuberculosis: Secondary | ICD-10-CM

## 2016-02-01 DIAGNOSIS — Z79899 Other long term (current) drug therapy: Secondary | ICD-10-CM | POA: Diagnosis not present

## 2016-02-01 DIAGNOSIS — R030 Elevated blood-pressure reading, without diagnosis of hypertension: Secondary | ICD-10-CM

## 2016-02-01 DIAGNOSIS — Z0001 Encounter for general adult medical examination with abnormal findings: Secondary | ICD-10-CM

## 2016-02-01 DIAGNOSIS — Z1212 Encounter for screening for malignant neoplasm of rectum: Secondary | ICD-10-CM

## 2016-02-01 LAB — CBC WITH DIFFERENTIAL/PLATELET
BASOS ABS: 108 {cells}/uL (ref 0–200)
Basophils Relative: 1 %
EOS ABS: 216 {cells}/uL (ref 15–500)
Eosinophils Relative: 2 %
HCT: 46.7 % (ref 38.5–50.0)
HEMOGLOBIN: 16.1 g/dL (ref 13.2–17.1)
LYMPHS ABS: 1944 {cells}/uL (ref 850–3900)
Lymphocytes Relative: 18 %
MCH: 31.4 pg (ref 27.0–33.0)
MCHC: 34.5 g/dL (ref 32.0–36.0)
MCV: 91 fL (ref 80.0–100.0)
MONO ABS: 648 {cells}/uL (ref 200–950)
MPV: 11.4 fL (ref 7.5–12.5)
Monocytes Relative: 6 %
NEUTROS ABS: 7884 {cells}/uL — AB (ref 1500–7800)
Neutrophils Relative %: 73 %
Platelets: 257 10*3/uL (ref 140–400)
RBC: 5.13 MIL/uL (ref 4.20–5.80)
RDW: 13.3 % (ref 11.0–15.0)
WBC: 10.8 10*3/uL (ref 3.8–10.8)

## 2016-02-01 LAB — MAGNESIUM: MAGNESIUM: 1.9 mg/dL (ref 1.5–2.5)

## 2016-02-01 LAB — LIPID PANEL
CHOL/HDL RATIO: 2.5 ratio (ref <=5.0)
Cholesterol: 165 mg/dL (ref 125–200)
HDL: 67 mg/dL (ref >=40)
LDL Cholesterol: 76 mg/dL (ref <130)
TRIGLYCERIDES: 111 mg/dL (ref <150)
VLDL: 22 mg/dL (ref <30)

## 2016-02-01 LAB — BASIC METABOLIC PANEL WITH GFR
BUN: 9 mg/dL (ref 7–25)
CO2: 27 mmol/L (ref 20–31)
CREATININE: 0.93 mg/dL (ref 0.60–1.35)
Calcium: 9.8 mg/dL (ref 8.6–10.3)
Chloride: 101 mmol/L (ref 98–110)
GFR, Est African American: >89 mL/min (ref >=60)
GFR, Est Non African American: >89 mL/min (ref >=60)
Glucose, Bld: 93 mg/dL (ref 65–99)
Potassium: 4 mmol/L (ref 3.5–5.3)
Sodium: 139 mmol/L (ref 135–146)

## 2016-02-01 LAB — HEPATIC FUNCTION PANEL
ALBUMIN: 4.6 g/dL (ref 3.6–5.1)
ALK PHOS: 44 U/L (ref 40–115)
ALT: 19 U/L (ref 9–46)
AST: 14 U/L (ref 10–40)
BILIRUBIN DIRECT: 0.2 mg/dL (ref <=0.2)
Indirect Bilirubin: 0.8 mg/dL (ref 0.2–1.2)
Total Bilirubin: 1 mg/dL (ref 0.2–1.2)
Total Protein: 7.5 g/dL (ref 6.1–8.1)

## 2016-02-01 LAB — IRON AND TIBC
%SAT: 41 % (ref 15–60)
IRON: 137 ug/dL (ref 50–180)
TIBC: 337 ug/dL (ref 250–425)
UIBC: 200 ug/dL (ref 125–400)

## 2016-02-01 LAB — HEMOGLOBIN A1C
Hgb A1c MFr Bld: 5.6 % (ref <5.7)
Mean Plasma Glucose: 114 mg/dL

## 2016-02-01 LAB — TSH: TSH: 1.47 m[IU]/L (ref 0.40–4.50)

## 2016-02-01 LAB — VITAMIN B12: VITAMIN B 12: 499 pg/mL (ref 200–1100)

## 2016-02-01 NOTE — Patient Instructions (Signed)
Recommend Adult Low Dose Aspirin or   coated  Aspirin 81 mg daily   To reduce risk of Colon Cancer 20 %,   Skin Cancer 26 % ,   Melanoma 46%   and   Pancreatic cancer 60%   ++++++++++++++++++++++++++++++++++++++++++++++++++++++ Vitamin D goal   is between 70-100.   Please make sure that you are taking your Vitamin D as directed.   It is very important as a natural anti-inflammatory   helping hair, skin, and nails, as well as reducing stroke and heart attack risk.   It helps your bones and helps with mood.  It also decreases numerous cancer risks so please take it as directed.   Low Vit D is associated with a 200-300% higher risk for CANCER   and 200-300% higher risk for HEART   ATTACK  &  STROKE.   .....................................Marland Kitchen  It is also associated with higher death rate at younger ages,   autoimmune diseases like Rheumatoid arthritis, Lupus, Multiple Sclerosis.     Also many other serious conditions, like depression, Alzheimer's  Dementia, infertility, muscle aches, fatigue, fibromyalgia - just to name a few.  ++++++++++++++++++++++++++++++++++++++++++++++++  Recommend the book "The END of DIETING" by Dr Excell Seltzer   & the book "The END of DIABETES " by Dr Excell Seltzer  At [email or web address] - get book & Audio CD's     Being diabetic has a  300% increased risk for heart attack, stroke, cancer, and alzheimer- type vascular dementia. It is very important that you work harder with diet by avoiding all foods that are white. Avoid white rice (brown & wild rice is OK), white potatoes (sweetpotatoes in moderation is OK), White bread or wheat bread or anything made out of white flour like bagels, donuts, rolls, buns, biscuits, cakes, pastries, cookies, pizza crust, and pasta (made from white flour & egg whites) - vegetarian pasta or spinach or wheat pasta is OK. Multigrain breads like Arnold's or Pepperidge [employer], or multigrain sandwich thins or flatbreads.  Diet,  exercise and weight loss can reverse and cure diabetes in the early stages.  Diet, exercise and weight loss is very important in the control and prevention of complications of diabetes which affects every system in your body, ie. Brain - dementia/stroke, eyes - glaucoma/blindness, heart - heart attack/heart failure, kidneys - dialysis, stomach - gastric paralysis, intestines - malabsorption, nerves - severe painful neuritis, circulation - gangrene & loss of a leg(s), and finally cancer and Alzheimers.    I recommend avoid fried & greasy foods,  sweets/candy, white rice (brown or wild rice or Quinoa is OK), white potatoes (sweet potatoes are OK) - anything made from white flour - bagels, doughnuts, rolls, buns, biscuits,white and wheat breads, pizza crust and traditional pasta made of white flour & egg white(vegetarian pasta or spinach or wheat pasta is OK).  Multi-grain bread is OK - like multi-grain flat bread or sandwich thins. Avoid alcohol in excess. Exercise is also important.    Eat all the vegetables you want - avoid meat, especially red meat and dairy - especially cheese.  Cheese is the most concentrated form of trans-fats which is the worst thing to clog up our arteries. Veggie cheese is OK which can be found in the fresh produce section at [employer] or [employer] or [employer]  ++++++++++++++++++++++++++++++++++++++++++++++++++ DASH Eating Plan  DASH stands for "Dietary Approaches to Stop Hypertension."   The DASH eating plan is a healthy eating plan that has been shown to reduce high blood  pressure (hypertension). Additional health benefits may include reducing the risk of type 2 diabetes mellitus, heart disease, and stroke. The DASH eating plan may also help with weight loss.  WHAT DO I NEED TO KNOW ABOUT THE DASH EATING PLAN?  For the DASH eating plan, you will follow these general guidelines:  Choose foods with a percent daily value for sodium of less than 5% (as listed on the food  label).  Use salt-free seasonings or herbs instead of table salt or sea salt.  Check with your health care provider or pharmacist before using salt substitutes.  Eat lower-sodium products, often labeled as "lower sodium" or "no salt added."  Eat fresh foods.  Eat more vegetables, fruits, and low-fat dairy products.    Choose whole grains. Look for the word "whole" as the first word in the ingredient list.  Choose fish   Limit sweets, desserts, sugars, and sugary drinks.  Choose heart-healthy fats.  Eat veggie cheese   Eat more home-cooked food and less restaurant, buffet, and fast food.  Limit fried foods.  Huffaker foods using methods other than frying.  Limit canned vegetables. If you do use them, rinse them well to decrease the sodium.  When eating at a restaurant, ask that your food be prepared with less salt, or no salt if possible.                      WHAT FOODS CAN I EAT?  Read Dr Fara Olden Fuhrman's books on The End of Dieting & The End of Diabetes  Grains  Whole grain or whole wheat bread. Brown rice. Whole grain or whole wheat pasta. Quinoa, bulgur, and whole grain cereals. Low-sodium cereals. Corn or whole wheat flour tortillas. Whole grain cornbread. Whole grain crackers. Low-sodium crackers.  Vegetables  Fresh or frozen vegetables (raw, steamed, roasted, or grilled). Low-sodium or reduced-sodium tomato and vegetable juices. Low-sodium or reduced-sodium tomato sauce and paste. Low-sodium or reduced-sodium canned vegetables.   Fruits  All fresh, canned (in natural juice), or frozen fruits.  Protein Products   All fish and seafood.  Dried beans, peas, or lentils. Unsalted nuts and seeds. Unsalted canned beans.  Dairy  Low-fat dairy products, such as skim or 1% milk, 2% or reduced-fat cheeses, low-fat ricotta or cottage cheese, or plain low-fat yogurt. Low-sodium or reduced-sodium cheeses.  Fats and Oils  Tub margarines without trans fats. Light or  reduced-fat mayonnaise and salad dressings (reduced sodium). Avocado. Safflower, olive, or canola oils. Natural peanut or almond butter.  Other  Unsalted popcorn and pretzels. The items listed above may not be a complete list of recommended foods or beverages. Contact your dietitian for more options.  +++++++++++++++++++++++++++++++++++++++++++  WHAT FOODS ARE NOT RECOMMENDED?  Grains/ White flour or wheat flour  White bread. White pasta. White rice. Refined cornbread. Bagels and croissants. Crackers that contain trans fat.  Vegetables  Creamed or fried vegetables. Vegetables in a . Regular canned vegetables. Regular canned tomato sauce and paste. Regular tomato and vegetable juices.  Fruits  Dried fruits. Canned fruit in light or heavy syrup. Fruit juice.  Meat and Other Protein Products  Meat in general - RED mwaet & White meat.  Fatty cuts of meat. Ribs, chicken wings, bacon, sausage, bologna, salami, chitterlings, fatback, hot dogs, bratwurst, and packaged luncheon meats.  Dairy  Whole or 2% milk, cream, half-and-half, and cream cheese. Whole-fat or sweetened yogurt. Full-fat cheeses or blue cheese. Nondairy creamers and whipped toppings. Processed cheese, cheese spreads, or  cheese curds.  Condiments  Onion and garlic salt, seasoned salt, table salt, and sea salt. Canned and packaged gravies. Worcestershire sauce. Tartar sauce. Barbecue sauce. Teriyaki sauce. Soy sauce, including reduced sodium. Steak sauce. Fish sauce. Oyster sauce. Cocktail sauce. Horseradish. Ketchup and mustard. Meat flavorings and tenderizers. Bouillon cubes. Hot sauce. Tabasco sauce. Marinades. Taco seasonings. Relishes.  Fats and Oils Butter, stick margarine, lard, shortening and bacon fat. Coconut, palm kernel, or palm oils. Regular salad dressings.  Pickles and olives. Salted popcorn and pretzels.  The items listed above may not be a complete list of foods and beverages to  avoid.  ++++++++++++++++++++++++++++++++++++++  Preventive Care for Adults  A healthy lifestyle and preventive care can promote health and wellness. Preventive health guidelines for men include the following key practices:  A routine yearly physical is a good way to check with your health care provider about your health and preventative screening. It is a chance to share any concerns and updates on your health and to receive a thorough exam.  Visit your dentist for a routine exam and preventative care every 6 months. Brush your teeth twice a day and floss once a day. Good oral hygiene prevents tooth decay and gum disease.  The frequency of eye exams is based on your age, health, family medical history, use of contact lenses, and other factors. Follow your health care provider's recommendations for frequency of eye exams.  Eat a healthy diet. Foods such as vegetables, fruits, whole grains, low-fat dairy products, and lean protein foods contain the nutrients you need without too many calories. Decrease your intake of foods high in solid fats, added sugars, and salt. Eat the right amount of calories for you.Get information about a proper diet from your health care provider, if necessary.  Regular physical exercise is one of the most important things you can do for your health. Most adults should get at least 150 minutes of moderate-intensity exercise (any activity that increases your heart rate and causes you to sweat) each week. In addition, most adults need muscle-strengthening exercises on 2 or more days a week.  Maintain a healthy weight. The body mass index (BMI) is a screening tool to identify possible weight problems. It provides an estimate of body fat based on height and weight. Your health care provider can find your BMI and can help you achieve or maintain a healthy weight.For adults 20 years and older:  A BMI below 18.5 is considered underweight.  A BMI of 18.5 to 24.9 is  normal.  A BMI of 25 to 29.9 is considered overweight.  A BMI of 30 and above is considered obese.  Maintain normal blood lipids and cholesterol levels by exercising and minimizing your intake of saturated fat. Eat a balanced diet with plenty of fruit and vegetables. Blood tests for lipids and cholesterol should begin at age 20 and be repeated every 5 years. If your lipid or cholesterol levels are high, you are over 50, or you are at high risk for heart disease, you may need your cholesterol levels checked more frequently.Ongoing high lipid and cholesterol levels should be treated with medicines if diet and exercise are not working.  If you smoke, find out from your health care provider how to quit. If you do not use tobacco, do not start.  Lung cancer screening is recommended for adults aged 55-80 years who are at high risk for developing lung cancer because of a history of smoking. A yearly low-dose CT scan of the lungs   is recommended for people who have at least a 30-pack-year history of smoking and are a current smoker or have quit within the past 15 years. A pack year of smoking is smoking an average of 1 pack of cigarettes a day for 1 year (for example: 1 pack a day for 30 years or 2 packs a day for 15 years). Yearly screening should continue until the smoker has stopped smoking for at least 15 years. Yearly screening should be stopped for people who develop a health problem that would prevent them from having lung cancer treatment.  If you choose to drink alcohol, do not have more than 2 drinks per day. One drink is considered to be 12 ounces (355 mL) of beer, 5 ounces (148 mL) of wine, or 1.5 ounces (44 mL) of liquor.  High blood pressure causes heart disease and increases the risk of stroke. Your blood pressure should be checked. Ongoing high blood pressure should be treated with medicines, if weight loss and exercise are not effective.  If you are 45-79 years old, ask your health care  provider if you should take aspirin to prevent heart disease.  Diabetes screening involves taking a blood sample to check your fasting blood sugar level. Testing should be considered at a younger age or be carried out more frequently if you are overweight and have at least 1 risk factor for diabetes.  Colorectal cancer can be detected and often prevented. Most routine colorectal cancer screening begins at the age of 50 and continues through age 75. However, your health care provider may recommend screening at an earlier age if you have risk factors for colon cancer. On a yearly basis, your health care provider may provide home test kits to check for hidden blood in the stool. Use of a small camera at the end of a tube to directly examine the colon (sigmoidoscopy or colonoscopy) can detect the earliest forms of colorectal cancer. Talk to your health care provider about this at age 50, when routine screening begins. Direct exam of the colon should be repeated every 5-10 years through age 75, unless early forms of precancerous polyps or small growths are found.  Screening for abdominal aortic aneurysm (AAA)  are recommended for persons over age 50 who have history of hypertensionor who are current or former smokers.  Talk with your health care provider about prostate cancer screening.  Testicular cancer screening is recommended for adult males. Screening includes self-exam, a health care provider exam, and other screening tests. Consult with your health care provider about any symptoms you have or any concerns you have about testicular cancer.  Use sunscreen. Apply sunscreen liberally and repeatedly throughout the day. You should seek shade when your shadow is shorter than you. Protect yourself by wearing long sleeves, pants, a wide-brimmed hat, and sunglasses year round, whenever you are outdoors.  Once a month, do a whole-body skin exam, using a mirror to look at the skin on your back. Tell your health  care provider about new moles, moles that have irregular borders, moles that are larger than a pencil eraser, or moles that have changed in shape or color.  Stay current with required vaccines (immunizations).  Influenza vaccine. All adults should be immunized every year.  Tetanus, diphtheria, and acellular pertussis (Td, Tdap) vaccine. An adult who has not previously received Tdap or who does not know his vaccine status should receive 1 dose of Tdap. This initial dose should be followed by tetanus and diphtheria toxoids (Td)   booster doses every 10 years. Adults with an unknown or incomplete history of completing a 3-dose immunization series with Td-containing vaccines should begin or complete a primary immunization series including a Tdap dose. Adults should receive a Td booster every 10 years.  Zoster vaccine. One dose is recommended for adults aged 60 years or older unless certain conditions are present.    Pneumococcal 13-valent conjugate (PCV13) vaccine. When indicated, a person who is uncertain of his immunization history and has no record of immunization should receive the PCV13 vaccine. An adult aged 19 years or older who has certain medical conditions and has not been previously immunized should receive 1 dose of PCV13 vaccine. This PCV13 should be followed with a dose of pneumococcal polysaccharide (PPSV23) vaccine. The PPSV23 vaccine dose should be obtained at least 8 weeks after the dose of PCV13 vaccine. An adult aged 19 years or older who has certain medical conditions and previously received 1 or more doses of PPSV23 vaccine should receive 1 dose of PCV13. The PCV13 vaccine dose should be obtained 1 or more years after the last PPSV23 vaccine dose.    Pneumococcal polysaccharide (PPSV23) vaccine. When PCV13 is also indicated, PCV13 should be obtained first. All adults aged 65 years and older should be immunized. An adult younger than age 65 years who has certain medical conditions  should be immunized. Any person who resides in a nursing home or long-term care facility should be immunized. An adult smoker should be immunized. People with an immunocompromised condition and certain other conditions should receive both PCV13 and PPSV23 vaccines. People with human immunodeficiency virus (HIV) infection should be immunized as soon as possible after diagnosis. Immunization during chemotherapy or radiation therapy should be avoided. Routine use of PPSV23 vaccine is not recommended for American Indians, [place] Natives, or people younger than 65 years unless there are medical conditions that require PPSV23 vaccine. When indicated, people who have unknown immunization and have no record of immunization should receive PPSV23 vaccine. One-time revaccination 5 years after the first dose of PPSV23 is recommended for people aged 19-64 years who have chronic kidney failure, nephrotic syndrome, asplenia, or immunocompromised conditions. People who received 1-2 doses of PPSV23 before age 65 years should receive another dose of PPSV23 vaccine at age 65 years or later if at least 5 years have passed since the previous dose. Doses of PPSV23 are not needed for people immunized with PPSV23 at or after age 65 years.  Hepatitis A vaccine. Adults who wish to be protected from this disease, have certain high-risk conditions, work with hepatitis A-infected animals, work in hepatitis A research labs, or travel to or work in countries with a high rate of hepatitis A should be immunized. Adults who were previously unvaccinated and who anticipate close contact with an international adoptee during the first 60 days after arrival in the [place] from a country with a high rate of hepatitis A should be immunized.  Hepatitis B vaccine. Adults should be immunized if they wish to be protected from this disease, have certain high-risk conditions, may be exposed to blood or other infectious body fluids, are household  contacts or sex partners of hepatitis B positive people, are clients or workers in certain care facilities, or travel to or work in countries with a high rate of hepatitis B.  Preventive Service / Frequency  Ages 19 to 39  Blood pressure check.  Lipid and cholesterol check.  Hepatitis C blood test.** / For any individual with known risks   for hepatitis C.  Skin self-exam. / Monthly.  Influenza vaccine. / Every year.  Tetanus, diphtheria, and acellular pertussis (Tdap, Td) vaccine.** / Consult your health care provider. 1 dose of Td every 10 years.  HPV vaccine. / 3 doses over 6 months, if 26 or younger.  Measles, mumps, rubella (MMR) vaccine.** / You need at least 1 dose of MMR if you were born in 1957 or later. You may also need a second dose.  Pneumococcal 13-valent conjugate (PCV13) vaccine.** / Consult your health care provider.  Pneumococcal polysaccharide (PPSV23) vaccine.** / 1 to 2 doses if you smoke cigarettes or if you have certain conditions.  Meningococcal vaccine.** / 1 dose if you are age 19 to 21 years and a first-year college student living in a residence hall, or have one of several medical conditions. You may also need additional booster doses.  Hepatitis A vaccine.** / Consult your health care provider.  Hepatitis B vaccine.** / Consult your health care provider.   

## 2016-02-01 NOTE — Progress Notes (Signed)
Patient ID: Timothy Rivers, male   DOB: May 12, 1986, 30 y.o.   MRN: [id number]  [facility]   [name], M.D.    Uvaldo Bristle. [name], P.A.-C      Starlyn Skeans, P.A.-C   [facility]                [address] [place], [place] [id number] Telephone [phone] Telefax [phone] _________________________________  Annual  Screening/Preventative Visit And Comprehensive Evaluation & Examination     This very nice 30 y.o. MWM presents for a Wellness/Preventative Visit & comprehensive evaluation and management of multiple medical co-morbidities.  Patient has been expectantly screened for elevated BP, Prediabetes, Hyperlipidemia and Vitamin D Deficiency.     Patient has prior hx/o elevated BP's 140/76 & 140/94 in 2011 and 2009 respectively and is monitored expectantly. Patient's BP has been controlled and today's BP: 128/84 mmHg. Patient denies any cardiac symptoms as chest pain, palpitations, shortness of breath, dizziness or ankle swelling.     Patient' has hx/o elevated lipids in the past, but now hyperlipidemia is controlled with diet.  Last lipids were at goal with T Chol 137, HDL 57, Trig 51 and LDL 70 in 10/2013.     Patient is screened proactively for prediabetes and patient denies reactive hypoglycemic symptoms, visual blurring, diabetic polys or paresthesias. Last A1c was 5.2% in 2015.      Finally, patient has history of Vitamin D Deficiency of  "60" in 2011, then "27" in 2016 and last vitamin D was still very low at "26" in 2015.   Medication Sig  . valACYclovir 1000 MG tablet Take 1/2 to 1 tablet daily as directed   No Known Allergies   Past Medical History  Diagnosis Date  . Hernia, umbilical    Health Maintenance  Topic Date Due  . TETANUS/TDAP  01/29/2004  . INFLUENZA VACCINE  04/03/2016  . HIV Screening  Completed   Immunization History  Administered Date(s) Administered  . PPD  Test 10/15/2013   No past surgical history on file.   Family History  Problem Relation Age of Onset  . Diabetes Maternal Aunt   . Diabetes Maternal Uncle   . Cancer Maternal Grandfather    Social History   Social History  . Marital Status: Married x 1 year    Spouse Name: N/A  . Number of Children: N/A  . Years of Education: N/A   Occupational History  . 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    Social History Main Topics  . Smoking status: Never Smoker   . Smokeless tobacco: Never Used  . Alcohol Use: 3.0 oz/week    5 Cans of beer per week  . Drug Use: Yes    Special: Marijuana  . Sexual Activity: Yes      ROS Constitutional: Denies fever, chills, weight loss/gain, headaches, insomnia,  night sweats or change in appetite. Does c/o fatigue. Eyes: Denies redness, blurred vision, diplopia, discharge, itchy or watery eyes.  ENT: Denies discharge, congestion, post nasal drip, epistaxis, sore throat, earache, hearing loss, dental pain, Tinnitus, Vertigo, Sinus pain or snoring.  Cardio: Denies chest pain, palpitations, irregular heartbeat, syncope, dyspnea, diaphoresis, orthopnea, PND, claudication or edema Respiratory: denies cough, dyspnea, DOE, pleurisy, hoarseness, laryngitis or wheezing.  Gastrointestinal: Denies dysphagia, heartburn, reflux, water brash, pain, cramps, nausea, vomiting, bloating, diarrhea, constipation, hematemesis, melena, hematochezia,  jaundice or hemorrhoids Genitourinary: Denies dysuria, frequency, urgency, nocturia, hesitancy, discharge, hematuria or flank pain Musculoskeletal: Denies arthralgia, myalgia, stiffness, Jt. Swelling, pain, limp or strain/sprain. Denies Falls. Skin: Denies puritis, rash, hives, warts, acne, eczema or change in skin lesion Neuro: No weakness, tremor, incoordination, spasms, paresthesia or pain Psychiatric: Denies confusion, memory loss or sensory loss. Denies Depression. Endocrine: Denies  change in weight, skin, hair change, nocturia, and paresthesia, diabetic polys, visual blurring or hyper / hypo glycemic episodes.  Heme/Lymph: No excessive bleeding, bruising or enlarged lymph nodes.  Physical Exam  BP 128/84 mmHg  Pulse 80  Temp(Src) 97.9 F (36.6 C)  Resp 16  Ht 6' (1.829 m)  Wt 197 lb 12.8 oz (89.721 kg)  BMI 26.82 kg/m2  General Appearance: Well nourished, in no apparent distress. Eyes: PERRLA, EOMs, conjunctiva no swelling or erythema, normal fundi and vessels. Sinuses: No frontal/maxillary tenderness ENT/Mouth: EACs patent / TMs  nl. Nares clear without erythema, swelling, mucoid exudates. Oral hygiene is good. No erythema, swelling, or exudate. Tongue normal, non-obstructing. Tonsils not swollen or erythematous. Hearing normal.  Neck: Supple, thyroid normal. No bruits, nodes or JVD. Respiratory: Respiratory effort normal.  BS equal and clear bilateral without rales, rhonci, wheezing or stridor. Cardio: Heart sounds are normal with regular rate and rhythm and no murmurs, rubs or gallops. Peripheral pulses are normal and equal bilaterally without edema. No aortic or femoral bruits. Chest: symmetric with normal excursions and percussion.  Abdomen: Soft, with Nl bowel sounds. Nontender, no guarding, rebound, hernias, masses, or organomegaly.  Lymphatics: Non tender without lymphadenopathy.  Genitourinary: No hernias.Testes nl. DRE - prostate nl for age - smooth & firm w/o nodules. Musculoskeletal: Full ROM all peripheral extremities, joint stability, 5/5 strength, and normal gait. Skin: Warm and dry without rashes, lesions, cyanosis, clubbing or  ecchymosis.  Neuro: Cranial nerves intact, reflexes equal bilaterally. Normal muscle tone, no cerebellar symptoms. Sensation intact.  Pysch: Alert and oriented X 3 with normal affect, insight and judgment appropriate.   Assessment and Plan  1. Annual Preventative/Screening Exam   - Microalbumin / creatinine urine  ratio - Urinalysis, Routine w reflex microscopic - Vitamin B12 - Iron and TIBC - Testosterone - CBC with Differential/Platelet - BASIC METABOLIC PANEL WITH GFR - Hepatic function panel - Magnesium - Lipid panel - TSH - Hemoglobin A1c - Insulin, random - VITAMIN D 25 Hydroxy   2. Elevated BP  - Microalbumin / creatinine urine ratio - TSH  3. Lipids abnormal  - Lipid panel - TSH  4. Other abnormal glucose  - Hemoglobin A1c - Insulin, random  5. Vitamin D deficiency  - VITAMIN D 25 Hydroxy  6. Screening for rectal cancer   7. Other fatigue  - Vitamin B12 - Iron and TIBC - Testosterone - CBC with Differential/Platelet - TSH  8. Medication management  - Urinalysis, Routine w reflex microscopic - CBC with Differential/Platelet - BASIC METABOLIC PANEL WITH GFR - Hepatic function panel - Magnesium   Continue prudent diet as discussed, weight control, BP monitoring, regular exercise, and medications as discussed.  Discussed med effects and SE's. Routine screening labs and tests as requested with regular follow-up as recommended. Over 40 minutes of exam, counseling, chart review and high complex critical decision making was performed

## 2016-02-02 LAB — MICROALBUMIN / CREATININE URINE RATIO
Creatinine, Urine: 46 mg/dL (ref 20–370)
Microalb, Ur: <0.2 mg/dL

## 2016-02-02 LAB — URINALYSIS, ROUTINE W REFLEX MICROSCOPIC
Bilirubin Urine: NEGATIVE
Glucose, UA: NEGATIVE
HGB URINE DIPSTICK: NEGATIVE
KETONES UR: NEGATIVE
Leukocytes, UA: NEGATIVE
NITRITE: NEGATIVE
Protein, ur: NEGATIVE
SPECIFIC GRAVITY, URINE: 1.008 (ref 1.001–1.035)
pH: 7 (ref 5.0–8.0)

## 2016-02-02 LAB — VITAMIN D 25 HYDROXY (VIT D DEFICIENCY, FRACTURES): VIT D 25 HYDROXY: 25 ng/mL — AB (ref 30–100)

## 2016-02-02 LAB — TESTOSTERONE: Testosterone: 272 ng/dL (ref 250–827)

## 2016-02-02 LAB — INSULIN, RANDOM: INSULIN: 5 u[IU]/mL (ref 2.0–19.6)

## 2016-08-21 ENCOUNTER — Ambulatory Visit (HOSPITAL_COMMUNITY)
Admission: RE | Admit: 2016-08-21 | Discharge: 2016-08-21 | Disposition: A | Discharge status: Home / Self Care | Payer: BLUE CROSS/BLUE SHIELD | Source: Ambulatory Visit | Attending: Physician Assistant | Admitting: Physician Assistant

## 2016-08-21 ENCOUNTER — Encounter: Payer: Self-pay | Admitting: Physician Assistant

## 2016-08-21 ENCOUNTER — Ambulatory Visit (INDEPENDENT_AMBULATORY_CARE_PROVIDER_SITE_OTHER): Payer: BLUE CROSS/BLUE SHIELD | Admitting: Physician Assistant

## 2016-08-21 DIAGNOSIS — I456 Pre-excitation syndrome: Secondary | ICD-10-CM

## 2016-08-21 DIAGNOSIS — R0789 Other chest pain: Secondary | ICD-10-CM | POA: Insufficient documentation

## 2016-08-21 DIAGNOSIS — R072 Precordial pain: Secondary | ICD-10-CM | POA: Diagnosis not present

## 2016-08-21 DIAGNOSIS — K2100 Gastro-esophageal reflux disease with esophagitis, without bleeding: Secondary | ICD-10-CM

## 2016-08-21 MED ORDER — METOCLOPRAMIDE HCL 5 MG PO TABS: 5.0000 mg | ORAL_TABLET | Freq: Three times a day (TID) | ORAL | 1 refills | Status: DC | PRN

## 2016-08-21 NOTE — Progress Notes (Signed)
   Subjective:    Patient ID: Timothy Rivers, male    DOB: 11-21-1985, 30 y.o.   MRN: [id number]  HPI 30 y.o. WM with history of reflux/GERD symptoms presents with chest discomfort. He has had GERD symptoms for a long time but for the past 3 days, he has been having epigastric/substernal pressure. He was in his attic this weekend, bending over a lot and felt some pressure, no accompaniments, no radiation, nonexertional.  He occ smokes cigars, and drinks 1-2 beers at dinner, denies NSAID use, no family history of cardiac issues/sudden death. Has seen GI, Dr. Collene Mares in the past for GI. Not taking any medications. No SOB, no cough, wheezing, fever, chills, palpitations.   Blood pressure 126/74, pulse (!) 102, resp. rate 16, height 6' (1.829 m), weight 194 lb (88 kg), SpO2 97 %.  Medications Current Outpatient Prescriptions on File Prior to Visit  Medication Sig  . valACYclovir (VALTREX) 1000 MG tablet Take 1/2 to 1 tablet daily as directed   No current facility-administered medications on file prior to visit.     Problem list He has Elevated BP; Lipids abnormal; Other abnormal glucose; Vitamin D deficiency; and Medication management on his problem list.   Review of Systems  Constitutional: Negative.  Negative for fatigue.  HENT: Negative.   Eyes: Negative.   Respiratory: Positive for chest tightness. Negative for shortness of breath.   Cardiovascular: Positive for chest pain. Negative for palpitations and leg swelling.  Gastrointestinal: Positive for abdominal distention and nausea. Negative for abdominal pain, anal bleeding, blood in stool, constipation, diarrhea, rectal pain and vomiting.  Genitourinary: Negative.   Musculoskeletal: Negative.  Negative for back pain.  Skin: Negative.   Neurological: Negative.  Negative for dizziness.       Objective:   Physical Exam  Constitutional: He is oriented to person, place, and time. He appears well-developed and well-nourished.  HENT:  Head:  Normocephalic and atraumatic.  Right Ear: External ear normal.  Left Ear: External ear normal.  Mouth/Throat: Oropharynx is clear and moist.  Eyes: Conjunctivae and EOM are normal. Pupils are equal, round, and reactive to light.  Neck: Normal range of motion. Neck supple.  Cardiovascular: Normal rate, regular rhythm and normal heart sounds.   Pulmonary/Chest: Effort normal and breath sounds normal.  Abdominal: Soft. Bowel sounds are normal. He exhibits no distension and no mass. There is tenderness (mild). There is no rebound and no guarding.  Musculoskeletal: Normal range of motion.  Neurological: He is alert and oriented to person, place, and time. No cranial nerve deficit.  Skin: Skin is warm and dry.  Psychiatric: He has a normal mood and affect. His behavior is normal.      Assessment & Plan:   Substernal discomfort ? GERD/IBS- has history will try reglan, discussed zoloft but declines at this time, diet discussed - metoCLOPramide (REGLAN) 5 MG tablet; Take 1 tablet (5 mg total) by mouth 3 (three) times daily as needed for nausea or refractory nausea / vomiting.  Dispense: 60 tablet; Refill: 1 Never with exertion, low risk MI however possible delta waves on EKG for possible WPW- no palpitations, dizziness, family history, will get holter monitor and may need referral to cardio If any dizziness, SOB, chest pain go to ER - EKG 12-Lead - DG Chest 2 View; Future  Wolff-Parkinson-White (WPW) pattern - Holter monitor - 48 hour; Future  Close follow up in 4 weeks.

## 2016-08-21 NOTE — Patient Instructions (Addendum)
Wolff-Parkinson-White Syndrome Introduction Wolff-Parkinson-White (WPW) syndrome is a heart condition that causes a fast and irregular heartbeat (arrhythmia) that comes and goes suddenly. People who have this condition were born with it (it is congenital). However, symptoms may not appear until the teen or adult years. What are the causes? This condition is caused by an extra electrical connection (pathway) between the top chambers of your heart (atria) and the bottom chambers of your heart (ventricles). This can cause an abnormal heart rhythm that comes and goes. What increases the risk? This condition is more likely to develop in people:  Who have a family history of WPW.  Who have another congenital heart defect.  Who are male. What are the signs or symptoms? Symptoms of this condition vary. Some people never have symptoms. Others have symptom that start at birth. Usually, symptoms start at 9-38 years of age. Symptoms start suddenly and may last for several minutes or a few hours. They are often triggered by exercise. If you have symptoms, they may include:  Feeling your heart "skip" beats (palpitations).  A fast heart rate.  Shortness of breath.  Light-headedness or dizziness.  Fatigue, especially with exercise.  Anxiety.  Chest pain.  Fainting.  Stopping of your heart (cardiac arrest). This is rare. How is this diagnosed? This condition is diagnosed with a medical history, physical exam, and electrical tests of your heart. Heart testing may include:  Electrocardiogram (ECG). This test shows the electrical activity of your heart.  Echocardiogram. This test uses sound waves to create an image of your heart.  Holter monitoring. This is a portable ECG that you wear for 24 hours.  Stress testing. This test is an ECG that is done during exercise.  Electrophysiology study. This test measures electrical activity in your heart. It uses a thin tube (catheter) that is inserted  in your heart through a blood vessel. How is this treated? Treatment for WPW depends on how often you have symptoms and what type of extra pathway you have. Treatment may include:  Medicine to stop the arrhythmia. You may get this medicine through an IV tube to stop an attack. Or, you may take it orally to prevent an attack.  Cardioversion. This is a controlled electrical shock. In extreme circumstances, it may be used to return your heart rate to normal.  Radiofrequency ablation. This is a surgical procedure that uses high-frequency radio waves to destroy the extra pathway. In some cases, your health care provider may only watch your condition for any changes. Follow these instructions at home:  Take over-the-counter and prescription medicines only as told by your health care provider.  Follow any exercise restrictions as told by your health care provider.  Keep all follow-up visits as told by your health care provider. This is important. Contact a health care provider if:  You are having symptoms of WPW.  Medicines do not control your symptoms. Get help right away if:  You have chest pain or difficulty breathing.  You pass out. This information is not intended to replace advice given to you by your health care provider. Make sure you discuss any questions you have with your health care provider. Document Released: 11/10/2003 Document Revised: 01/26/2016 Document Reviewed: 03/09/2015  2017 Elsevier

## 2016-08-21 NOTE — Progress Notes (Signed)
Pt made aware of CXR & voiced understanding

## 2016-09-05 ENCOUNTER — Encounter: Payer: Self-pay | Admitting: Internal Medicine

## 2016-09-05 ENCOUNTER — Ambulatory Visit (INDEPENDENT_AMBULATORY_CARE_PROVIDER_SITE_OTHER): Payer: BLUE CROSS/BLUE SHIELD | Admitting: Internal Medicine

## 2016-09-05 VITALS — BP 130/90 | HR 96 | Ht 72.0 in | Wt 187.8 lb

## 2016-09-05 DIAGNOSIS — I456 Pre-excitation syndrome: Secondary | ICD-10-CM | POA: Diagnosis not present

## 2016-09-05 NOTE — Patient Instructions (Signed)
Medication Instructions:  Your physician recommends that you continue on your current medications as directed. Please refer to the Current Medication list given to you today.   Labwork: None Ordered   Testing/Procedures: None Ordered   Follow-Up: Your physician wants you to follow-up in: 1 year with Dr. Lovena Le. You will receive a reminder letter in the mail two months in advance. If you don't receive a letter, please call our office to schedule the follow-up appointment.     Any Other Special Instructions Will Be Listed Below (If Applicable).

## 2016-09-05 NOTE — Progress Notes (Signed)
      HPI Timothy Rivers is referred by Dr. Melford Aase for evaluation of an abnormal ECG. 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 He denies chest pain or sob. No palpitations whatsoever. He has never had syncope. There is no family history of any arrhythmias or sudden death. He gets epigastric pain when he bends over.  No Known Allergies   Current Outpatient Prescriptions  Medication Sig Dispense Refill  . metoCLOPramide (REGLAN) 5 MG tablet Take 1 tablet (5 mg total) by mouth 3 (three) times daily as needed for nausea or refractory nausea / vomiting. 60 tablet 1   No current facility-administered medications for this visit.      Past Medical History:  Diagnosis Date  . Hernia, umbilical     ROS:   All systems reviewed and negative except as noted in the HPI.   History reviewed. No pertinent surgical history.   Family History  Problem Relation Age of Onset  . Diabetes Maternal Aunt   . Diabetes Maternal Uncle   . Cancer Maternal Grandfather      Social History   Social History  . Marital status: Single    Spouse name: N/A  . Number of children: N/A  . Years of education: N/A   Occupational History  . Not on file.   Social History Main Topics  . Smoking status: Current Every Day Smoker    Types: Cigars  . Smokeless tobacco: Never Used  . Alcohol use 3.0 oz/week    5 Cans of beer per week  . Drug use:     Types: Marijuana  . Sexual activity: Yes   Other Topics Concern  . Not on file   Social History Narrative  . No narrative on file     BP 130/90   Pulse 96   Ht 6' (1.829 m)   Wt 187 lb 12.8 oz (85.2 kg)   BMI 25.47 kg/m   Physical Exam:  Well appearing 31 yo man, NAD HEENT: Unremarkable Neck:  6 cm JVD, no thyromegally Lymphatics:  No adenopathy Back:  No CVA tenderness Lungs:  Clear with no wheezes HEART:  Regular rate rhythm, no murmurs, no rubs, no clicks Abd:  soft, positive  bowel sounds, no organomegally, no rebound, no guarding Ext:  2 plus pulses, no edema, no cyanosis, no clubbing Skin:  No rashes no nodules Neuro:  CN II through XII intact, motor grossly intact  EKG - nsr with ventricular pre-excitation   Assess/Plan: 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 I would recommend watchful waiting and avoidance of stimulants and ETOH as much as possible. I have discussed the warning signs and asked him to call for problems. I would not suggest an ablation unless he develops symptoms of palpitations or has documented sustained heart racing. Finally, the use of vagal maneuvers were discussed. I spent 45 minutes including 50% of the time face to face with the patient. Mikle [email or web address].D.

## 2016-09-20 ENCOUNTER — Ambulatory Visit: Payer: Self-pay | Admitting: Physician Assistant

## 2016-09-21 ENCOUNTER — Ambulatory Visit: Payer: Self-pay | Admitting: Internal Medicine

## 2016-10-03 ENCOUNTER — Ambulatory Visit (INDEPENDENT_AMBULATORY_CARE_PROVIDER_SITE_OTHER): Payer: BLUE CROSS/BLUE SHIELD | Admitting: Internal Medicine

## 2016-10-03 ENCOUNTER — Encounter: Payer: Self-pay | Admitting: Internal Medicine

## 2016-10-03 VITALS — BP 124/80 | HR 76 | Temp 97.0°F | Resp 16 | Ht 72.0 in | Wt 192.2 lb

## 2016-10-03 DIAGNOSIS — R0789 Other chest pain: Secondary | ICD-10-CM | POA: Diagnosis not present

## 2016-10-03 DIAGNOSIS — I456 Pre-excitation syndrome: Secondary | ICD-10-CM | POA: Diagnosis not present

## 2016-10-03 HISTORY — DX: Pre-excitation syndrome: I45.6

## 2016-10-03 MED ORDER — PREDNISONE 20 MG PO TABS: ORAL_TABLET | ORAL | 2 refills | Status: DC

## 2016-10-03 NOTE — Progress Notes (Signed)
  Subjective:    Patient ID: Timothy Rivers, male    DOB: 04/28/1986, 31 y.o.   MRN: [id number]  HPI   This nice 31 yo SWM had recent screening EKG finding WPW and had consultation by Dr Cristopher Peru who counseled patient  in warning sx's of tachyarrhythmias and recommended observation. 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 Denied exertional component or associated dyspnea or palpitations.    Medication Sig  No current meds  No Known Allergies   Past Medical History:  Diagnosis Date  . Hernia, umbilical    Review of Systems  10 point systems review negative except as above.     Objective:   Physical Exam  BP 124/80   Pulse 76   Temp 97 F (36.1 C)   Resp 16   Ht 6' (1.829 m)   Wt 192 lb 3.2 oz (87.2 kg)   BMI 26.07 kg/m   HEENT - Eac's patent. TM's Nl. EOM's full. PERRLA. NasoOroPharynx clear. Neck - supple. Nl Thyroid. Carotids 2+ & No bruits, nodes, JVD Chest - Clear equal BS w/o Rales, rhonchi, wheezes. Sl tender in parasternal areas.  Cor - Nl HS. RRR w/o sig MGR. PP 1(+). No edema. Abd -Soft & benign. MS- FROM w/o deformities. Muscle power, tone and bulk Nl. Gait Nl. Neuro - Nl w/o focal abnormalities.    Assessment & Plan:    1. Chest wall pain  - predniSONE (DELTASONE) 20 MG tablet; 1 tab 3 x day for 3 days, then 1 tab 2 x day for 3 days, then 1 tab 1 x day for 5 days  Dispense: 20 tablet; Refill: 2  - advised trial w/heating pad  - recommended graduated exercise and discussed prudent diet

## 2016-10-03 NOTE — Patient Instructions (Signed)
Costochondritis Costochondritis is swelling and irritation (inflammation) of the tissue (cartilage) that connects your ribs to your breastbone (sternum). This causes pain in the front of your chest. The pain usually starts gradually and involves more than one rib. What are the causes? The exact cause of this condition is not always known. It results from stress on the cartilage where your ribs attach to your sternum. The cause of this stress could be:  Chest injury (trauma).  Exercise or activity, such as lifting.  Severe coughing. What increases the risk? You may be at higher risk for this condition if you:  Are male.  Are 30?32 years old.  Recently started a new exercise or work activity.  Have low levels of vitamin D.  Have a condition that makes you cough frequently. What are the signs or symptoms? The main symptom of this condition is chest pain. The pain:  Usually starts gradually and can be sharp or dull.  Gets worse with deep breathing, coughing, or exercise.  Gets better with rest.  May be worse when you press on the sternum-rib connection (tenderness). How is this diagnosed? This condition is diagnosed based on your symptoms, medical history, and a physical exam. Your health care provider will check for tenderness when pressing on your sternum. This is the most important finding. You may also have tests to rule out other causes of chest pain. These may include:  A chest X-ray to check for lung problems.  An electrocardiogram (ECG) to see if you have a heart problem that could be causing the pain.  An imaging scan to rule out a chest or rib fracture. How is this treated? This condition usually goes away on its own over time. Your health care provider may prescribe an NSAID to reduce pain and inflammation. Your health care provider may also suggest that you:  Rest and avoid activities that make pain worse.  Apply heat or cold to the area to reduce pain and  inflammation.  Do exercises to stretch your chest muscles. If these treatments do not help, your health care provider may inject a numbing medicine at the sternum-rib connection to help relieve the pain. Follow these instructions at home:  Avoid activities that make pain worse. This includes any activities that use chest, abdominal, and side muscles.  If directed, put ice on the painful area:  Put ice in a plastic bag.  Place a towel between your skin and the bag.  Leave the ice on for 20 minutes, 2-3 times a day.  If directed, apply heat to the affected area as often as told by your health care provider. Use the heat source that your health care provider recommends, such as a moist heat pack or a heating pad.  Place a towel between your skin and the heat source.  Leave the heat on for 20-30 minutes.  Remove the heat if your skin turns bright red. This is especially important if you are unable to feel pain, heat, or cold. You may have a greater risk of getting burned.  Take over-the-counter and prescription medicines only as told by your health care provider.  Return to your normal activities as told by your health care provider. Ask your health care provider what activities are safe for you.  Keep all follow-up visits as told by your health care provider. This is important. Contact a health care provider if:  You have chills or a fever.  Your pain does not go away or it gets worse.    You have a cough that does not go away (is persistent). Get help right away if:  You have shortness of breath. This information is not intended to replace advice given to you by your health care provider. Make sure you discuss any questions you have with your health care provider. Document Released: 05/30/2005 Document Revised: 03/09/2016 Document Reviewed: 12/14/2015 Elsevier Interactive Patient Education  2017 [employer]  

## 2016-10-26 ENCOUNTER — Ambulatory Visit: Payer: Self-pay | Admitting: Internal Medicine

## 2016-10-29 ENCOUNTER — Encounter: Payer: Self-pay | Admitting: Internal Medicine

## 2016-10-29 ENCOUNTER — Ambulatory Visit (INDEPENDENT_AMBULATORY_CARE_PROVIDER_SITE_OTHER): Payer: BLUE CROSS/BLUE SHIELD | Admitting: Internal Medicine

## 2016-10-29 VITALS — BP 130/80 | HR 80 | Temp 98.4°F | Resp 12 | Ht 72.0 in | Wt 195.0 lb

## 2016-10-29 DIAGNOSIS — K2100 Gastro-esophageal reflux disease with esophagitis, without bleeding: Secondary | ICD-10-CM

## 2016-10-29 MED ORDER — ESOMEPRAZOLE MAGNESIUM 40 MG PO CPDR: DELAYED_RELEASE_CAPSULE | ORAL | 1 refills | Status: DC

## 2016-10-29 NOTE — Patient Instructions (Signed)
Food Choices for Gastroesophageal Reflux Disease, Adult  When you have gastroesophageal reflux disease (GERD), the foods you eat and your eating habits are very important. Choosing the right foods can help ease the discomfort of GERD. What general guidelines do I need to follow?  Choose fruits, vegetables, whole grains, low-fat dairy products, and low-fat meat, fish, and poultry.  Limit fats such as oils, salad dressings, butter, nuts, and avocado.  Keep a food diary to identify foods that cause symptoms.  Avoid foods that cause reflux. These may be different for different people.  Eat frequent small meals instead of three large meals each day.  Eat your meals slowly, in a relaxed setting.  Limit fried foods.  Cook foods using methods other than frying.  Avoid drinking alcohol.  Avoid drinking large amounts of liquids with your meals.  Avoid bending over or lying down until 2-3 hours after eating. What foods are not recommended? The following are some foods and drinks that may worsen your symptoms: Vegetables  Tomatoes. Tomato juice. Tomato and spaghetti sauce. Chili peppers. Onion and garlic. Horseradish. Fruits  Oranges, grapefruit, and lemon (fruit and juice). Meats  High-fat meats, fish, and poultry. This includes hot dogs, ribs, ham, sausage, salami, and bacon. Dairy  Whole milk and chocolate milk. Sour cream. Cream. Butter. Ice cream. Cream cheese. Beverages  Coffee and tea, with or without caffeine. Carbonated beverages or energy drinks. Condiments  Hot sauce. Barbecue sauce. Sweets/Desserts  Chocolate and cocoa. Donuts. Peppermint and spearmint. Fats and Oils  High-fat foods, including [place] fries and potato chips. Other  Vinegar. Strong spices, such as black pepper, white pepper, red pepper, cayenne, curry powder, cloves, ginger, and chili powder. The items listed above may not be a complete list of foods and beverages to avoid. Contact your dietitian for more  information.  This information is not intended to replace advice given to you by your health care provider. Make sure you discuss any questions you have with your health care provider. Document Released: 08/20/2005 Document Revised: 01/26/2016 Document Reviewed: 06/24/2013 Elsevier Interactive Patient Education  2017 [employer].

## 2016-10-29 NOTE — Progress Notes (Signed)
  Subjective    Patient ID: Timothy Rivers, male    DOB: 10-Apr-1986, 31 y.o.   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 Note patient had a w/u in 2014 with Negative Abd/Pelvic CT scan, U/S GB and Hida scan and Normal Gastric emptying scan and the had a neg EGD by Dr Collene Mares. Patient reports the minimal tenderness of the sternal/para-sternal and positional exacerbation is better , but he continues to have a constant vague discomfort in the mid sternal or SS location possible better with OTC Ranitidine and he's tried Aleve with improvement. Denies hot acidy or sour bitter water brash or definite reflux.   Medication Sig  . valACYclovir (VALTREX) 1000 MG tablet Take 1,000 mg by mouth 2 (two) times daily. As directed.   Past Medical History:  Diagnosis Date  . Hernia, umbilical    No Known Allergies    Review of Systems  10 point systems review negative except as above.    Objective:   Physical Exam  BP 130/80   Pulse 80   Temp 98.4 F (36.9 C)   Resp 12   Ht 6' (1.829 m)   Wt 195 lb (88.5 kg)   BMI 26.45 kg/m   HEENT - WNL Neck - supple. Nl Thyroid. Carotids 2+ & No bruits, nodes, JVD Chest - Clear equal BS w/o Rales, rhonchi, wheezes. Cor - Nl HS. RRR w/o sig MGR. PP 1(+). No edema. Abd - No palpable organomegaly, masses or tenderness. BS nl. MS- FROM w/o deformities. Muscle power, tone and bulk Nl. Gait Nl. Neuro -Nl w/o focal abnormalities.    Assessment & Plan:   1. Gastroesophageal reflux disease with esophagitis  - Discussed DDx with patient & will try Rx PPI  - esomeprazole (NEXIUM) 40 MG capsule; Take 1 capsule daily for Acid Reflux & Indigestion  Dispense: 90 capsule; Refill: 1  - discussed anti-dyspeptic diet & exercise

## 2016-11-10 ENCOUNTER — Other Ambulatory Visit: Payer: Self-pay | Admitting: Internal Medicine

## 2016-11-10 DIAGNOSIS — K1379 Other lesions of oral mucosa: Secondary | ICD-10-CM

## 2016-11-12 ENCOUNTER — Encounter: Payer: Self-pay | Admitting: Internal Medicine

## 2016-11-27 ENCOUNTER — Ambulatory Visit (INDEPENDENT_AMBULATORY_CARE_PROVIDER_SITE_OTHER): Payer: BLUE CROSS/BLUE SHIELD | Admitting: Internal Medicine

## 2016-11-27 ENCOUNTER — Encounter: Payer: Self-pay | Admitting: Internal Medicine

## 2016-11-27 VITALS — BP 132/98 | HR 72 | Temp 97.5°F | Resp 16 | Ht 72.0 in | Wt 194.4 lb

## 2016-11-27 DIAGNOSIS — M940 Chondrocostal junction syndrome [Tietze]: Secondary | ICD-10-CM | POA: Diagnosis not present

## 2016-11-27 MED ORDER — MELOXICAM 15 MG PO TABS: ORAL_TABLET | ORAL | 1 refills | Status: DC

## 2016-11-27 MED ORDER — PREDNISONE 20 MG PO TABS: ORAL_TABLET | ORAL | 1 refills | Status: DC

## 2016-11-27 NOTE — Progress Notes (Signed)
  Subjective:    Patient ID: Timothy Rivers, male    DOB: 01/21/1986, 31 y.o.   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 He denies assosciated dyspnea or exertional component or palpitations. Had an initial trial on prednisone with equivocal benefit, but would like to try again. A 1 month trial on Nexium was of no perceived help.   Medication Sig  . esomeprazole40 MG Take 1 capsule daily for Acid Reflux & Indigestion  . valACYclovir  1000 MG TAKE 1/2 TO 1 TABLET DAILY AS DIRECTED   No Known Allergies   Past Medical History:  Diagnosis Date  . Hernia, umbilical    Review of Systems  10 point systems review negative except as above.    Objective:   Physical Exam  BP 132/98 - confirmed x 2 , P 72, T  97.5 F, R 16, Ht  6' , Wt 194 lb   HEENT - WNL. Neck - supple. Nl Thyroid. Chest - Clear equal BS. Sl parasternal tenderness.  Cor - Nl HS. RRR w/o sig murmur. Abd - Soft. Benign. Non-Tender MS- FROM.  Gait Nl. Neuro -  Nl w/o focal abnormalities.    Assessment & Plan:   1. Costochondritis  - 1st Rx -  predniSONE  20 MG tablet; 1 tab 3 x day for 3 days, then 1 tab 2 x day for 3 days, then 1 tab 1 x day for 5 days  Dispense: 20 tablet; Refill: 1  - Then follow with Rx - meloxicam  15 MG tablet; Take 1/2 to 1 tablet daily with food for Pain & Inflammation  Dispense: 90 tablet; Refill: 1  - Discussed meds/SE's, Dash diet & exercise - stretching exercises

## 2016-11-27 NOTE — Patient Instructions (Addendum)
Costochondritis Costochondritis is swelling and irritation (inflammation) of the tissue (cartilage) that connects your ribs to your breastbone (sternum). This causes pain in the front of your chest. The pain usually starts gradually and involves more than one rib. What are the causes? The exact cause of this condition is not always known. It results from stress on the cartilage where your ribs attach to your sternum. The cause of this stress could be:  Chest injury (trauma).  Exercise or activity, such as lifting.  Severe coughing. What increases the risk? You may be at higher risk for this condition if you  Are 37?32 years old.  Recently started a new exercise or work activity.  Have low levels of vitamin D.  Have a condition that makes you cough frequently. What are the signs or symptoms? The main symptom of this condition is chest pain. The pain:  Usually starts gradually and can be sharp or dull.  Gets worse with deep breathing, coughing, or exercise.  Gets better with rest.  May be worse when you press on the sternum-rib connection (tenderness). How is this diagnosed? This condition is diagnosed based on your symptoms, medical history, and a physical exam. Your health care provider will check for tenderness when pressing on your sternum. This is the most important finding. You may also have tests to rule out other causes of chest pain. These may include:  A chest X-ray to check for lung problems.  An electrocardiogram (ECG) to see if you have a heart problem that could be causing the pain.  An imaging scan to rule out a chest or rib fracture. How is this treated? This condition usually goes away on its own over time. Your health care provider may prescribe an NSAID to reduce pain and inflammation. Your health care provider may also suggest that you:  Rest and avoid activities that make pain worse.  Apply heat or cold to the area to reduce pain and inflammation.  Do  exercises to stretch your chest muscles. If these treatments do not help, your health care provider may inject a numbing medicine at the sternum-rib connection to help relieve the pain. Follow these instructions at home:  Avoid activities that make pain worse. This includes any activities that use chest, abdominal, and side muscles.  If directed, put ice on the painful area:  Put ice in a plastic bag.  Place a towel between your skin and the bag.  Leave the ice on for 20 minutes, 2-3 times a day.  If directed, apply heat to the affected area as often as told by your health care provider. Use the heat source that your health care provider recommends, such as a moist heat pack or a heating pad.  Place a towel between your skin and the heat source.  Leave the heat on for 20-30 minutes.  Remove the heat if your skin turns bright red. This is especially important if you are unable to feel pain, heat, or cold. You may have a greater risk of getting burned.  Take over-the-counter and prescription medicines only as told by your health care provider.  Return to your normal activities as told by your health care provider. Ask your health care provider what activities are safe for you.  Keep all follow-up visits as told by your health care provider. This is important. Contact a health care provider if:  You have chills or a fever.  Your pain does not go away or it gets worse.  You have  a cough that does not go away (is persistent). Get help right away if:  You have shortness of breath. This information is not intended to replace advice given to you by your health care provider. Make sure you discuss any questions you have with your health care provider. Document Released: 05/30/2005 Document Revised: 03/09/2016 Document Reviewed: 12/14/2015 Elsevier Interactive Patient Education  2017 [employer].

## 2017-01-22 ENCOUNTER — Telehealth: Payer: Self-pay | Admitting: *Deleted

## 2017-01-22 DIAGNOSIS — M940 Chondrocostal junction syndrome [Tietze]: Secondary | ICD-10-CM

## 2017-01-22 MED ORDER — PREDNISONE 20 MG PO TABS: ORAL_TABLET | ORAL | 0 refills | Status: DC

## 2017-01-22 NOTE — Telephone Encounter (Signed)
Patient called and states he continues to have chest pain.  He states Meloxicam is not helping the pain, that hurts during the day and not just on exertion. Per Dr Melford Aase, send in an RX for Prednisone 20 mg and patient was informed to call back for an OV in 3 weeks, if he is still having pain.

## 2017-02-06 ENCOUNTER — Ambulatory Visit (INDEPENDENT_AMBULATORY_CARE_PROVIDER_SITE_OTHER): Payer: BLUE CROSS/BLUE SHIELD | Admitting: Internal Medicine

## 2017-02-06 ENCOUNTER — Encounter: Payer: Self-pay | Admitting: Internal Medicine

## 2017-02-06 VITALS — BP 132/82 | HR 76 | Temp 98.1°F | Resp 16 | Ht 72.0 in | Wt 194.4 lb

## 2017-02-06 DIAGNOSIS — R072 Precordial pain: Secondary | ICD-10-CM | POA: Diagnosis not present

## 2017-02-06 MED ORDER — TRAMADOL HCL 50 MG PO TABS: ORAL_TABLET | ORAL | 0 refills | Status: AC

## 2017-02-06 MED ORDER — DULOXETINE HCL 60 MG PO CPEP: ORAL_CAPSULE | ORAL | 1 refills | Status: DC

## 2017-02-06 NOTE — Progress Notes (Signed)
  Subjective:    Patient ID: Timothy Rivers, male    DOB: 1985-11-01, 31 y.o.   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 He describes the discomfort as constant and occasionally awakening him from sleep. He denies any GI sx's or dietary triggers.   Review of Systems  10 point systems review negative except as above.    Objective:   Physical Exam  BP 132/82   Pulse 76   Temp 98.1 F (36.7 C)   Resp 16   Ht 6' (1.829 m)   Wt 194 lb 6.4 oz (88.2 kg)   BMI 26.37 kg/m   HEENT - WNL. Neck - supple.  Chest - Clear equal BS. (+) parasternal CC junction tenderness.  Cor - Nl HS. RRR w/o sig MGR. PP 1(+). No edema. MS- FROM w/o deformities.  Gait Nl. Neuro -  Nl w/o focal abnormalities.    Assessment & Plan:   1. Precordial pain  - DULoxetine (CYMBALTA) 60 MG capsule; Take 1 capsule daily for pain  Dispense: 90 capsule; Refill: 1 - traMADol (ULTRAM) 50 MG tablet; Take 1 tablet every 4 hours if needed for severe pain  Dispense: 30 tablet; Refill: 0  -  Reassured & discussed meds & SE's and ROV in 6 weeks to reassess.

## 2017-03-21 ENCOUNTER — Ambulatory Visit: Payer: Self-pay | Admitting: Internal Medicine

## 2017-03-26 ENCOUNTER — Ambulatory Visit (INDEPENDENT_AMBULATORY_CARE_PROVIDER_SITE_OTHER): Payer: BLUE CROSS/BLUE SHIELD | Admitting: Internal Medicine

## 2017-03-26 ENCOUNTER — Encounter: Payer: Self-pay | Admitting: Internal Medicine

## 2017-03-26 VITALS — BP 142/80 | HR 79 | Temp 97.9°F | Resp 16 | Ht 72.0 in | Wt 187.6 lb

## 2017-03-26 DIAGNOSIS — M940 Chondrocostal junction syndrome [Tietze]: Secondary | ICD-10-CM | POA: Diagnosis not present

## 2017-03-26 MED ORDER — GABAPENTIN 100 MG PO CAPS: ORAL_CAPSULE | ORAL | 1 refills | Status: DC

## 2017-03-26 NOTE — Progress Notes (Signed)
  Subjective:    Patient ID: Timothy Rivers, male    DOB: 1986/03/29, 31 y.o.   MRN: [id number]  HPI  This nice 31 yo WM with CP attributed to Costochondritis returns for f/u after a trial on Cymbalta w/o perceived benefit. Patient has been having query CP dating back since Jan 2017 and has also been seen by GI & Cardiology with negative w/u's. He describes a constant mild ache in the lower sternal area with positional exacerbation with torso twisting or bending. 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 An empiric  trial on a prednisone pulse/taper did give short term improvement. Denies any cough or dyspnea. CXR has been negative.   Medication Sig  . DULoxetine (CYMBALTA) 60 MG capsule Take 1 capsule daily for pain  . valACYclovir (VALTREX) 1000 MG tablet TAKE 1/2 TO 1 TABLET DAILY AS DIRECTED   Past Medical History:  Diagnosis Date  . Hernia, umbilical    No Known Allergies  Review of Systems  10 point systems review negative except as above.    Objective:   Physical Exam   BP (!) 142/80   Pulse 79   Temp 97.9 F (36.6 C)   Resp 16   Ht 6' (1.829 m)   Wt 187 lb 9.6 oz (85.1 kg)   BMI 25.44 kg/m   HEENT - WNL. Neck - supple.  Chest - Clear equal BS. Cor - Nl HS. RRR w/o sig MGR. PP 1(+). No edema. Abd - benign MS- FROM w/o deformities.  Gait Nl. Neuro -  Nl w/o focal abnormalities.    Assessment & Plan:   1. Costochondritis  - gabapentin (NEURONTIN) 100 MG capsule; Take 1 capsule up to 3 x / day for pain  Dispense: 90 capsule; Refill: 1  - discussed sedation precautions & advised slow gradual titration up to 300 mg/daily and that could call for RX Gabapentin 300 mg caps next.  - discussed prudent diet (DASH) and exercise.

## 2017-04-01 ENCOUNTER — Ambulatory Visit: Payer: Self-pay | Admitting: Internal Medicine

## 2017-05-09 ENCOUNTER — Other Ambulatory Visit: Payer: Self-pay | Admitting: Internal Medicine

## 2017-05-09 DIAGNOSIS — M940 Chondrocostal junction syndrome [Tietze]: Secondary | ICD-10-CM

## 2017-05-09 DIAGNOSIS — R079 Chest pain, unspecified: Secondary | ICD-10-CM

## 2017-05-09 DIAGNOSIS — R0789 Other chest pain: Secondary | ICD-10-CM | POA: Insufficient documentation

## 2017-05-09 MED ORDER — GABAPENTIN 600 MG PO TABS: ORAL_TABLET | ORAL | 1 refills | Status: DC

## 2017-05-18 ENCOUNTER — Other Ambulatory Visit: Payer: Self-pay | Admitting: Internal Medicine

## 2017-05-18 DIAGNOSIS — M940 Chondrocostal junction syndrome [Tietze]: Secondary | ICD-10-CM

## 2017-12-16 ENCOUNTER — Other Ambulatory Visit: Payer: Self-pay | Admitting: *Deleted

## 2017-12-16 ENCOUNTER — Encounter: Payer: Self-pay | Admitting: Internal Medicine

## 2017-12-16 ENCOUNTER — Ambulatory Visit: Payer: BC Managed Care – PPO | Admitting: Internal Medicine

## 2017-12-16 VITALS — BP 122/80 | HR 84 | Temp 97.5°F | Resp 16 | Ht 72.0 in | Wt 208.4 lb

## 2017-12-16 DIAGNOSIS — Z13220 Encounter for screening for lipoid disorders: Secondary | ICD-10-CM

## 2017-12-16 DIAGNOSIS — Z111 Encounter for screening for respiratory tuberculosis: Secondary | ICD-10-CM | POA: Diagnosis not present

## 2017-12-16 DIAGNOSIS — R7309 Other abnormal glucose: Secondary | ICD-10-CM

## 2017-12-16 DIAGNOSIS — R5383 Other fatigue: Secondary | ICD-10-CM

## 2017-12-16 DIAGNOSIS — Z0000 Encounter for general adult medical examination without abnormal findings: Secondary | ICD-10-CM

## 2017-12-16 DIAGNOSIS — Z77011 Contact with and (suspected) exposure to lead: Secondary | ICD-10-CM

## 2017-12-16 DIAGNOSIS — Z1329 Encounter for screening for other suspected endocrine disorder: Secondary | ICD-10-CM

## 2017-12-16 DIAGNOSIS — Z0001 Encounter for general adult medical examination with abnormal findings: Secondary | ICD-10-CM

## 2017-12-16 DIAGNOSIS — Z23 Encounter for immunization: Secondary | ICD-10-CM | POA: Diagnosis not present

## 2017-12-16 DIAGNOSIS — Z1389 Encounter for screening for other disorder: Secondary | ICD-10-CM

## 2017-12-16 DIAGNOSIS — Z79899 Other long term (current) drug therapy: Secondary | ICD-10-CM

## 2017-12-16 DIAGNOSIS — K2100 Gastro-esophageal reflux disease with esophagitis, without bleeding: Secondary | ICD-10-CM

## 2017-12-16 DIAGNOSIS — M940 Chondrocostal junction syndrome [Tietze]: Secondary | ICD-10-CM

## 2017-12-16 DIAGNOSIS — Z130 Encounter for screening for diseases of the blood and blood-forming organs and certain disorders involving the immune mechanism: Secondary | ICD-10-CM

## 2017-12-16 DIAGNOSIS — R079 Chest pain, unspecified: Secondary | ICD-10-CM

## 2017-12-16 DIAGNOSIS — E559 Vitamin D deficiency, unspecified: Secondary | ICD-10-CM | POA: Diagnosis not present

## 2017-12-16 DIAGNOSIS — Z1212 Encounter for screening for malignant neoplasm of rectum: Secondary | ICD-10-CM

## 2017-12-16 DIAGNOSIS — R0989 Other specified symptoms and signs involving the circulatory and respiratory systems: Secondary | ICD-10-CM

## 2017-12-16 DIAGNOSIS — Z131 Encounter for screening for diabetes mellitus: Secondary | ICD-10-CM | POA: Diagnosis not present

## 2017-12-16 DIAGNOSIS — E7889 Other lipoprotein metabolism disorders: Secondary | ICD-10-CM

## 2017-12-16 MED ORDER — GABAPENTIN 600 MG PO TABS: ORAL_TABLET | ORAL | 1 refills | Status: DC

## 2017-12-16 NOTE — Progress Notes (Signed)
Timothy Rivers   Timothy Rivers, M.D.     Timothy Rivers. Timothy Rivers, P.A.-C Timothy Rivers, [place]                [address] [place] [address] Telephone [phone] Telefax [phone] Annual  Screening/Preventative Visit  & Comprehensive Evaluation & Examination     This very nice 32 y.o. MWM presents for a Screening/Preventative Visit & comprehensive evaluation and management of multiple medical co-morbidities.  Patient has been followed for labile HTN, HLD, Prediabetes and Vitamin D Deficiency. 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     Labile HTN predates since  2009 (BP 140/94) and 2011 (BP 140/76) and he's been monitored expectantly. Patient's BP has been controlled at home.  Today's BP is at goal - 122/80.  Patient has hx/o WPW and was evaluated by Dr Cristopher Peru who in the absence of historical arrhythmias/palpitations  recommended active surveillance.  Patient does have hx/o Costochondritis for which he takes Gabapentin.  Patient denies any cardiac symptoms as exertional chest pain, palpitations, shortness of breath, dizziness or ankle swelling.     Patient's lipids are controlled with diet. Last lipids were at goal: Lab Results  Component Value Date   CHOL 165 02/01/2016   HDL 67 02/01/2016   LDLCALC 76 02/01/2016   TRIG 111 02/01/2016   CHOLHDL 2.5 02/01/2016      Patient has been monitored proactively for prediabetes and patient denies reactive hypoglycemic symptoms, visual blurring, diabetic polys or paresthesias. Last A1c was Normal & at goal: Lab Results  Component Value Date   HGBA1C 5.6 02/01/2016       Finally, patient has history of Vitamin D Deficiency ("37"/2011 and "26"/2015)  and last vitamin D was still very low:  Lab Results  Component Value Date   VD25OH 25 (L) 02/01/2016   Medication Sig  . gabapentin  (NEURONTIN) 600 MG tablet Take 1/2 to 1 tablet 2 to 3 x / day as needed for pain   No Known Allergies   Past Medical History:  Diagnosis Date  . Hernia, umbilical    Health Maintenance  Topic Date Due  . TETANUS/TDAP  01/29/2004  . INFLUENZA VACCINE  04/03/2018  . HIV Screening  Completed   Immunization History  Administered Date(s) Administered  . PPD Test 10/15/2013, 02/01/2016   No past surgical history.  Family History  Problem Relation Age of Onset  . Diabetes Maternal Aunt   . Diabetes Maternal Uncle   . Cancer Maternal Grandfather    Social History   Socioeconomic History  . Marital status: Married  Occupational History  . Owner of a sand blasting business  Tobacco Use  . Smoking status: Current Every Day Smoker    Types: Cigars  . Smokeless tobacco: Never Used  Substance and Sexual Activity  . Alcohol use: Yes    Alcohol/week: 3.0 oz    Types: 5 Cans of beer per week  . Drug use: Yes    Types: Marijuana  . Sexual activity: Yes  Lifestyle  . Physical activity: physical job    ROS Constitutional: Denies fever, chills, weight loss/gain, headaches, insomnia,  night sweats or change in appetite. Does c/o fatigue. Eyes: Denies redness, blurred vision, diplopia, discharge, itchy or watery eyes.  ENT: Denies discharge, congestion, post nasal drip, epistaxis,  sore throat, earache, hearing loss, dental pain, Tinnitus, Vertigo, Sinus pain or snoring.  Cardio: Denies chest pain, palpitations, irregular heartbeat, syncope, dyspnea, diaphoresis, orthopnea, PND, claudication or edema Respiratory: denies cough, dyspnea, DOE, pleurisy, hoarseness, laryngitis or wheezing.  Gastrointestinal: Denies dysphagia, heartburn, reflux, water brash, pain, cramps, nausea, vomiting, bloating, diarrhea, constipation, hematemesis, melena, hematochezia, jaundice or hemorrhoids Genitourinary: Denies dysuria, frequency, urgency, nocturia, hesitancy, discharge, hematuria or flank  pain Musculoskeletal: Denies arthralgia, myalgia, stiffness, Jt. Swelling, pain, limp or strain/sprain. Denies Falls. Skin: Denies puritis, rash, hives, warts, acne, eczema or change in skin lesion Neuro: No weakness, tremor, incoordination, spasms, paresthesia or pain Psychiatric: Denies confusion, memory loss or sensory loss. Denies Depression. Endocrine: Denies change in weight, skin, hair change, nocturia, and paresthesia, diabetic polys, visual blurring or hyper / hypo glycemic episodes.  Heme/Lymph: No excessive bleeding, bruising or enlarged lymph nodes.  Physical Exam  BP 122/80   Pulse 84   Temp (!) 97.5 F (36.4 C)   Resp 16   Ht 6' (1.829 m)   Wt 208 lb 6.4 oz (94.5 kg)   BMI 28.26 kg/m   General Appearance: Well nourished and well groomed and in no apparent distress.  Eyes: PERRLA, EOMs, conjunctiva no swelling or erythema, normal fundi and vessels. Sinuses: No frontal/maxillary tenderness ENT/Mouth: EACs patent / TMs  nl. Nares clear without erythema, swelling, mucoid exudates. Oral hygiene is good. No erythema, swelling, or exudate. Tongue normal, non-obstructing. Tonsils not swollen or erythematous. Hearing normal.  Neck: Supple, thyroid not palpable. No bruits, nodes or JVD. Respiratory: Respiratory effort normal.  BS equal and clear bilateral without rales, rhonci, wheezing or stridor. Cardio: Heart sounds are normal with regular rate and rhythm and no murmurs, rubs or gallops. Peripheral pulses are normal and equal bilaterally without edema. No aortic or femoral bruits. Chest: symmetric with normal excursions and percussion.  Abdomen: Soft, with Nl bowel sounds. Nontender, no guarding, rebound, hernias, masses, or organomegaly.  Lymphatics: Non tender without lymphadenopathy.  Genitourinary: No hernias.Testes nl. DRE - prostate nl for age - smooth & firm w/o nodules. Musculoskeletal: Full ROM all peripheral extremities, joint stability, 5/5 strength, and normal  gait. Skin: Warm and dry without rashes, lesions, cyanosis, clubbing or  ecchymosis.  Neuro: Cranial nerves intact, reflexes equal bilaterally. Normal muscle tone, no cerebellar symptoms. Sensation intact.  Pysch: Alert and oriented X 3 with normal affect, insight and judgment appropriate.   Assessment and Plan  1. Annual Preventative/Screening Exam   2. Labile hypertension  - Urinalysis, Routine w reflex microscopic - CBC with Differential/Platelet - BASIC METABOLIC PANEL WITH GFR - Magnesium - TSH  3. Lipids abnormal  - Hepatic function panel - Lipid panel  4. Abnormal glucose  - Hemoglobin A1c - Insulin, random  5. Vitamin D deficiency  - VITAMIN D 25 Hydroxy   6. Gastroesophageal reflux disease  - CBC with Differential/Platelet  7. Screening for rectal cancer  - POC Hemoccult Bld/Stl  8. Screening examination for pulmonary tuberculosis  - PPD  9. Other fatigue  - Iron,Total/Total Iron Binding Cap - Vitamin B12 - Testosterone - CBC with Differential/Platelet - TSH  10. Medication management  - Urinalysis, Routine w reflex microscopic - Microalbumin / creatinine urine ratio - CBC with Differential/Platelet - BASIC METABOLIC PANEL WITH GFR - Hepatic function panel - Magnesium - Lipid panel - TSH - Hemoglobin A1c - Insulin, random - VITAMIN D 25 Hydroxy  - Lead, blood   11. Lead exposure  - Lead, blood  12. Need for prophylactic vaccination with combined diphtheria-tetanus-pertussis (DTP) vaccine  - Tdap vaccine greater than or equal to 7yo IM           Patient was counseled in prudent diet, weight control to achieve/maintain BMI less than 25, BP monitoring, regular exercise and medications as discussed.  Discussed med effects and SE's. Routine screening labs and tests as requested with regular follow-up as recommended. Over 40 minutes of exam, counseling, chart review and high complex critical decision making was performed

## 2017-12-16 NOTE — Patient Instructions (Addendum)
Lead Blood Test Why am I having this test? Lead is a naturally occurring metal that is known to be poisonous. Environmental exposure to high amounts of lead can result in lead poisoning. When this happens, excess lead in your blood is deposited into tissues throughout your body, but especially in your brain and nervous system. Although lead levels can be tested in urine, bones, teeth, and hair, blood tests for lead are the most common. Your health care provider may order a lead blood test if:  A child shows signs of learning impairment or developmental delay.  You have reduced kidney function, and other tests have not revealed a cause.  You have decreased mental functioning.  You are experiencing: ? Muscle weakness. ? Frequent headaches. ? Memory loss. ? Mood disorders.  You are a woman and have had repeated miscarriages or preterm deliveries.  What kind of sample is taken? A blood sample is required for this test. It is usually collected by inserting a needle into a vein or by sticking a finger with a small needle. How do I prepare for this test? There is no preparation required for this test. What are the reference values? Reference values are considered healthy values established after testing a large group of healthy people. Reference values may vary among different people, labs, and hospitals. It is your responsibility to obtain your test results. Ask the lab or department performing the test when and how you will get your results. The reference value for lead in the blood is less than 10 mcg/dL. What do the results mean? Results that are greater than the reference value may indicate you have been exposed to environmental lead or have lead poisoning. Talk with your health care provider to discuss your results, treatment options, and if necessary, the need for more tests. Talk with your health care provider if you have any questions about your results. Talk with your health care  provider to discuss your results, treatment options, and if necessary, the need for more tests. Talk with your health care provider if you have any questions about your results. This information is not intended to replace advice given to you by your health care provider. Make sure you discuss any questions you have with your health care provider. Document Released: 09/22/2004 Document Revised: 04/25/2016 Document Reviewed: 01/07/2014 Elsevier Interactive Patient Education  2018 [place]. +++++++++++++++++++++++++++  Lead Poisoning Information What is lead poisoning? Lead is a substance that occurs naturally in the earth. It can be found in soil, air, and water. Lead is very poisonous (toxic) to humans. Lead poisoning occurs when there is enough lead in a person's body to be harmful to his or her health. A person can get lead poisoning from:  Lead paint.  Small particles of lead in the soil or air.  Water that is contaminated with lead from the soil or from lead pipes.  Working at a job where lead is used.  Having hobbies that involve the use of lead.  Lead poisoning is usually diagnosed with a blood test. The amount of lead in the blood is measured in micrograms per deciliter (mcg/dL). A child with a blood level of 5 mcg/dL or higher may be at risk for lead poisoning. A high level for an adult is 45 mcg/dL. How does lead poisoning happen? Lead poisoning can happen when a person swallows or breathes in lead. It is possible to get lead poisoning from a single exposure to a high amount of lead. Usually, lead  slowly builds up in the body over time until it reaches a dangerous level. Lead can affect many areas of the body. Products that are made with lead may get into the air or the soil. Lead that gets into the soil may get into drinking water. Lead is also used to make many commonly used products. What are some common sources of lead? Lead-based paint has been the most common source of  lead for many years. Most paint no longer contains lead. However, houses that were built before 1978 may have been painted with lead-based paint. Sources of lead that result from this include:  Soil outside of old houses.  Dust or paint chips inside old houses.  Lead in soil and dust where old houses are being renovated or demolished.  Vacant lots where old buildings once stood.  Other possible sources of lead exposure include:  Old painted toys or furniture.  Bullets or fishing sinkers.  Pipes or faucets.  Stained glass windows.  Materials used in [employer], Teacher, adult education, glazing, Insurance claims handler.  Batteries.  Ceramics.  Pewter pitchers and dinnerware.  Hair dyes.  Air, soil, or water near industrial sites.  Areas where lead has been mined, smelted, or refined.  Who is at risk for lead poisoning? Children are most at risk for lead poisoning because:  They are more likely to eat lead paint chips or put objects that contain lead into their mouths.  They absorb lead into their systems more quickly than adults do.  Their bodies and brains are still developing, so they are more vulnerable to the toxic effects of lead.  Pregnant women and babies in the womb also have a higher-than-normal risk for lead poisoning. Lead that builds up in the blood over many years is stored in the bones along with calcium. Pregnancy causes calcium to be released into the blood. Lead may also be released. This release of lead can be toxic to a pregnant woman and can also affect a developing baby. Adults may be more at risk if they work in jobs where lead exposure is common. These include house remodeling or demolition, welding, bridge or water tower maintenance, ammunition manufacturing, and jobs that involve regular exposure to car batteries. Adults may also have an increased risk of lead poisoning if they have hobbies that involve using lead for soldering or glazing. What are the symptoms of lead  poisoning? Symptoms are different for children, pregnant women, and other adults. Symptoms in children  Slowed growth.  Learning problems.  Hyperactivity or behavior problems.  Low red blood cell count (anemia).  Reduced hearing.  Kidney damage.  Seizures. Symptoms in pregnant women  Giving birth too early (prematurely).  Having a baby with a low birth weight. Symptoms in other adults  High blood pressure.  Kidney damage.  Infertility.  Nerve, muscle, or joint problems.  Irritability.  Memory loss or concentration problems. How is lead poisoning treated? Treatment includes removing the sources of lead in the environment. Lead can also be removed from the body by taking a type of medicine that binds to the lead so it can be eliminated in urine (chelation therapy). What can I do to prevent lead poisoning?  Have children tested for lead.  Have your house checked for lead paint, especially if you live in a house or an apartment that was built before 1978.  Children and pregnant women should stay out of any house built before 1978 that is being renovated.  Do not let children play in  the dirt, especially in vacant lots.  Do not store food or drink in ceramic pottery that may have lead glazes.  For drinking or cooking, use only cold water from your tap or use bottled water. Hot water has more dissolved lead.  Run tap water for a minute before using it for cooking or drinking.  Mop your floors often.  Wash your child's hands and face before he or she eats.  Wash any toys that children may put into their mouths.  Do not store alcoholic drinks in lead crystal decanters. Where can I find more information? Environmental Protection Agency: [employer] This information is not intended to replace advice given to you by your health care provider. Make sure you discuss any questions you have with your health care provider. Document Released: 09/27/2004  Document Revised: 07/16/2016 Document Reviewed: 08/25/2014 Elsevier Interactive Patient Education  2017 [place].  +++++++++++++++++++++++++++ Preventive Care for Adults  A healthy lifestyle and preventive care can promote health and wellness. Preventive health guidelines for men include the following key practices:  A routine yearly physical is a good way to check with your health care provider about your health and preventative screening. It is a chance to share any concerns and updates on your health and to receive a thorough exam.  Visit your dentist for a routine exam and preventative care every 6 months. Brush your teeth twice a day and floss once a day. Good oral hygiene prevents tooth decay and gum disease.  The frequency of eye exams is based on your age, health, family medical history, use of contact lenses, and other factors. Follow your health care provider's recommendations for frequency of eye exams.  Eat a healthy diet. Foods such as vegetables, fruits, whole grains, low-fat dairy products, and lean protein foods contain the nutrients you need without too many calories. Decrease your intake of foods high in solid fats, added sugars, and salt. Eat the right amount of calories for you.Get information about a proper diet from your health care provider, if necessary.  Regular physical exercise is one of the most important things you can do for your health. Most adults should get at least 150 minutes of moderate-intensity exercise (any activity that increases your heart rate and causes you to sweat) each week. In addition, most adults need muscle-strengthening exercises on 2 or more days a week.  Maintain a healthy weight. The body mass index (BMI) is a screening tool to identify possible weight problems. It provides an estimate of body fat based on height and weight. Your health care provider can find your BMI and can help you achieve or maintain a healthy weight.For adults 20  years and older:  A BMI below 18.5 is considered underweight.  A BMI of 18.5 to 24.9 is normal.  A BMI of 25 to 29.9 is considered overweight.  A BMI of 30 and above is considered obese.  Maintain normal blood lipids and cholesterol levels by exercising and minimizing your intake of saturated fat. Eat a balanced diet with plenty of fruit and vegetables. Blood tests for lipids and cholesterol should begin at age 43 and be repeated every 5 years. If your lipid or cholesterol levels are high, you are over 50, or you are at high risk for heart disease, you may need your cholesterol levels checked more frequently.Ongoing high lipid and cholesterol levels should be treated with medicines if diet and exercise are not working.  If you smoke, find out from your health care provider how  to quit. If you do not use tobacco, do not start.  Lung cancer screening is recommended for adults aged 73-80 years who are at high risk for developing lung cancer because of a history of smoking. A yearly low-dose CT scan of the lungs is recommended for people who have at least a 30-pack-year history of smoking and are a current smoker or have quit within the past 15 years. A pack year of smoking is smoking an average of 1 pack of cigarettes a day for 1 year (for example: 1 pack a day for 30 years or 2 packs a day for 15 years). Yearly screening should continue until the smoker has stopped smoking for at least 15 years. Yearly screening should be stopped for people who develop a health problem that would prevent them from having lung cancer treatment.  If you choose to drink alcohol, do not have more than 2 drinks per day. One drink is considered to be 12 ounces (355 mL) of beer, 5 ounces (148 mL) of wine, or 1.5 ounces (44 mL) of liquor.  High blood pressure causes heart disease and increases the risk of stroke. Your blood pressure should be checked. Ongoing high blood pressure should be treated with medicines, if weight  loss and exercise are not effective.  If you are 62-35 years old, ask your health care provider if you should take aspirin to prevent heart disease.  Diabetes screening involves taking a blood sample to check your fasting blood sugar level. Testing should be considered at a younger age or be carried out more frequently if you are overweight and have at least 1 risk factor for diabetes.  Colorectal cancer can be detected and often prevented. Most routine colorectal cancer screening begins at the age of 59 and continues through age 76. However, your health care provider may recommend screening at an earlier age if you have risk factors for colon cancer. On a yearly basis, your health care provider may provide home test kits to check for hidden blood in the stool. Use of a small camera at the end of a tube to directly examine the colon (sigmoidoscopy or colonoscopy) can detect the earliest forms of colorectal cancer. Talk to your health care provider about this at age 14, when routine screening begins. Direct exam of the colon should be repeated every 5-10 years through age 28, unless early forms of precancerous polyps or small growths are found.  Screening for abdominal aortic aneurysm (AAA)  are recommended for persons over age 89 who have history of hypertensionor who are current or former smokers.  Talk with your health care provider about prostate cancer screening.  Testicular cancer screening is recommended for adult males. Screening includes self-exam, a health care provider exam, and other screening tests. Consult with your health care provider about any symptoms you have or any concerns you have about testicular cancer.  Use sunscreen. Apply sunscreen liberally and repeatedly throughout the day. You should seek shade when your shadow is shorter than you. Protect yourself by wearing long sleeves, pants, a wide-brimmed hat, and sunglasses year round, whenever you are outdoors.  Once a month, do a  whole-body skin exam, using a mirror to look at the skin on your back. Tell your health care provider about new moles, moles that have irregular borders, moles that are larger than a pencil eraser, or moles that have changed in shape or color.  Stay current with required vaccines (immunizations).  Influenza vaccine. All adults should be immunized  every year.  Tetanus, diphtheria, and acellular pertussis (Td, Tdap) vaccine. An adult who has not previously received Tdap or who does not know his vaccine status should receive 1 dose of Tdap. This initial dose should be followed by tetanus and diphtheria toxoids (Td) booster doses every 10 years. Adults with an unknown or incomplete history of completing a 3-dose immunization series with Td-containing vaccines should begin or complete a primary immunization series including a Tdap dose. Adults should receive a Td booster every 10 years.  Zoster vaccine. One dose is recommended for adults aged 84 years or older unless certain conditions are present.    Pneumococcal 13-valent conjugate (PCV13) vaccine. When indicated, a person who is uncertain of his immunization history and has no record of immunization should receive the PCV13 vaccine. An adult aged 9 years or older who has certain medical conditions and has not been previously immunized should receive 1 dose of PCV13 vaccine. This PCV13 should be followed with a dose of pneumococcal polysaccharide (PPSV23) vaccine. The PPSV23 vaccine dose should be obtained at least 8 weeks after the dose of PCV13 vaccine. An adult aged 14 years or older who has certain medical conditions and previously received 1 or more doses of PPSV23 vaccine should receive 1 dose of PCV13. The PCV13 vaccine dose should be obtained 1 or more years after the last PPSV23 vaccine dose.    Pneumococcal polysaccharide (PPSV23) vaccine. When PCV13 is also indicated, PCV13 should be obtained first. All adults aged 69 years and older should  be immunized. An adult younger than age 36 years who has certain medical conditions should be immunized. Any person who resides in a nursing home or long-term care facility should be immunized. An adult smoker should be immunized. People with an immunocompromised condition and certain other conditions should receive both PCV13 and PPSV23 vaccines. People with human immunodeficiency virus (HIV) infection should be immunized as soon as possible after diagnosis. Immunization during chemotherapy or radiation therapy should be avoided. Routine use of PPSV23 vaccine is not recommended for American Indians, [place] Natives, or people younger than 65 years unless there are medical conditions that require PPSV23 vaccine. When indicated, people who have unknown immunization and have no record of immunization should receive PPSV23 vaccine. One-time revaccination 5 years after the first dose of PPSV23 is recommended for people aged 19-64 years who have chronic kidney failure, nephrotic syndrome, asplenia, or immunocompromised conditions. People who received 1-2 doses of PPSV23 before age 26 years should receive another dose of PPSV23 vaccine at age 60 years or later if at least 5 years have passed since the previous dose. Doses of PPSV23 are not needed for people immunized with PPSV23 at or after age 37 years.  Hepatitis A vaccine. Adults who wish to be protected from this disease, have certain high-risk conditions, work with hepatitis A-infected animals, work in hepatitis A research labs, or travel to or work in countries with a high rate of hepatitis A should be immunized. Adults who were previously unvaccinated and who anticipate close contact with an international adoptee during the first 60 days after arrival in the [place] from a country with a high rate of hepatitis A should be immunized.  Hepatitis B vaccine. Adults should be immunized if they wish to be protected from this disease, have certain high-risk  conditions, may be exposed to blood or other infectious body fluids, are household contacts or sex partners of hepatitis B positive people, are clients or workers in certain care facilities,  or travel to or work in countries with a high rate of hepatitis B.  Preventive Service / Frequency  Ages 12 to 55  Blood pressure check.  Lipid and cholesterol check.  Hepatitis C blood test.** / For any individual with known risks for hepatitis C.  Skin self-exam. / Monthly.  Influenza vaccine. / Every year.  Tetanus, diphtheria, and acellular pertussis (Tdap, Td) vaccine.** / Consult your health care provider. 1 dose of Td every 10 years.  HPV vaccine. / 3 doses over 6 months, if 53 or younger.  Measles, mumps, rubella (MMR) vaccine.** / You need at least 1 dose of MMR if you were born in 1957 or later. You may also need a second dose.  Pneumococcal 13-valent conjugate (PCV13) vaccine.** / Consult your health care provider.  Pneumococcal polysaccharide (PPSV23) vaccine.** / 1 to 2 doses if you smoke cigarettes or if you have certain conditions.  Meningococcal vaccine.** / 1 dose if you are age 98 to 41 years and a Market researcher living in a residence hall, or have one of several medical conditions. You may also need additional booster doses.  Hepatitis A vaccine.** / Consult your health care provider.  Hepatitis B vaccine.** / Consult your health care provider. +++++++++ Recommend Adult Low Dose Aspirin or  coated  Aspirin 81 mg daily  To reduce risk of Colon Cancer 20 %,  Skin Cancer 26 % ,  Melanoma 46%  and  Pancreatic cancer 60% ++++++++++++++++++ Vitamin D goal  is between 70-100.  Please make sure that you are taking your Vitamin D as directed.  It is very important as a natural anti-inflammatory  helping hair, skin, and nails, as well as reducing stroke and heart attack risk.  It helps your bones and helps with mood. It also decreases numerous cancer risks so  please take it as directed.  Low Vit D is associated with a 200-300% higher risk for CANCER  and 200-300% higher risk for HEART   ATTACK  &  STROKE.   .....................................Marland Kitchen It is also associated with higher death rate at younger ages,  autoimmune diseases like Rheumatoid arthritis, Lupus, Multiple Sclerosis.    Also many other serious conditions, like depression, Alzheimer's Dementia, infertility, muscle aches, fatigue, fibromyalgia - just to name a few. +++++++++++++++++++++ Recommend the book "The END of DIETING" by Dr Excell Seltzer  & the book "The END of DIABETES " by Dr Excell Seltzer At [email or web address] - get book & Audio CD's    Being diabetic has a  300% increased risk for heart attack, stroke, cancer, and alzheimer- type vascular dementia. It is very important that you work harder with diet by avoiding all foods that are white. Avoid white rice (brown & wild rice is OK), white potatoes (sweetpotatoes in moderation is OK), White bread or wheat bread or anything made out of white flour like bagels, donuts, rolls, buns, biscuits, cakes, pastries, cookies, pizza crust, and pasta (made from white flour & egg whites) - vegetarian pasta or spinach or wheat pasta is OK. Multigrain breads like Arnold's or Pepper[employer], or multigrain sandwich thins or flatbreads.  Diet, exercise and weight loss can reverse and cure diabetes in the early stages.  Diet, exercise and weight loss is very important in the control and prevention of complications of diabetes which affects every system in your body, ie. Brain - dementia/stroke, eyes - glaucoma/blindness, heart - heart attack/heart failure, kidneys - dialysis, stomach - gastric paralysis, intestines - malabsorption, nerves - severe painful  neuritis, circulation - gangrene & loss of a leg(s), and finally cancer and Alzheimers.    I recommend avoid fried & greasy foods,  sweets/candy, white rice (brown or wild rice or Quinoa is OK), white potatoes  (sweet potatoes are OK) - anything made from white flour - bagels, doughnuts, rolls, buns, biscuits,white and wheat breads, pizza crust and traditional pasta made of white flour & egg white(vegetarian pasta or spinach or wheat pasta is OK).  Multi-grain bread is OK - like multi-grain flat bread or sandwich thins. Avoid alcohol in excess. Exercise is also important.    Eat all the vegetables you want - avoid meat, especially red meat and dairy - especially cheese.  Cheese is the most concentrated form of trans-fats which is the worst thing to clog up our arteries. Veggie cheese is OK which can be found in the fresh produce section at [employer] or [employer] or [employer]  +++++++++++++++++++ DASH Eating Plan  DASH stands for "Dietary Approaches to Stop Hypertension."   The DASH eating plan is a healthy eating plan that has been shown to reduce high blood pressure (hypertension). Additional health benefits may include reducing the risk of type 2 diabetes mellitus, heart disease, and stroke. The DASH eating plan may also help with weight loss. WHAT DO I NEED TO KNOW ABOUT THE DASH EATING PLAN? For the DASH eating plan, you will follow these general guidelines:  Choose foods with a percent daily value for sodium of less than 5% (as listed on the food label).  Use salt-free seasonings or herbs instead of table salt or sea salt.  Check with your health care provider or pharmacist before using salt substitutes.  Eat lower-sodium products, often labeled as "lower sodium" or "no salt added."  Eat fresh foods.  Eat more vegetables, fruits, and low-fat dairy products.  Choose whole grains. Look for the word "whole" as the first word in the ingredient list.  Choose fish   Limit sweets, desserts, sugars, and sugary drinks.  Choose heart-healthy fats.  Eat veggie cheese   Eat more home-cooked food and less restaurant, buffet, and fast food.  Limit fried foods.  Cook foods using methods  other than frying.  Limit canned vegetables. If you do use them, rinse them well to decrease the sodium.  When eating at a restaurant, ask that your food be prepared with less salt, or no salt if possible.                      WHAT FOODS CAN I EAT? Read Dr Fara Olden Fuhrman's books on The End of Dieting & The End of Diabetes  Grains Whole grain or whole wheat bread. Brown rice. Whole grain or whole wheat pasta. Quinoa, bulgur, and whole grain cereals. Low-sodium cereals. Corn or whole wheat flour tortillas. Whole grain cornbread. Whole grain crackers. Low-sodium crackers.  Vegetables Fresh or frozen vegetables (raw, steamed, roasted, or grilled). Low-sodium or reduced-sodium tomato and vegetable juices. Low-sodium or reduced-sodium tomato sauce and paste. Low-sodium or reduced-sodium canned vegetables.   Fruits All fresh, canned (in natural juice), or frozen fruits.  Protein Products  All fish and seafood.  Dried beans, peas, or lentils. Unsalted nuts and seeds. Unsalted canned beans.  Dairy Low-fat dairy products, such as skim or 1% milk, 2% or reduced-fat cheeses, low-fat ricotta or cottage cheese, or plain low-fat yogurt. Low-sodium or reduced-sodium cheeses.  Fats and Oils Tub margarines without trans fats. Light or reduced-fat mayonnaise and salad dressings (reduced  sodium). Avocado. Safflower, olive, or canola oils. Natural peanut or almond butter.  Other Unsalted popcorn and pretzels. The items listed above may not be a complete list of recommended foods or beverages. Contact your dietitian for more options.  +++++++++++++++++++  WHAT FOODS ARE NOT RECOMMENDED? Grains/ White flour or wheat flour White bread. White pasta. White rice. Refined cornbread. Bagels and croissants. Crackers that contain trans fat.  Vegetables  Creamed or fried vegetables. Vegetables in a . Regular canned vegetables. Regular canned tomato sauce and paste. Regular tomato and vegetable  juices.  Fruits Dried fruits. Canned fruit in light or heavy syrup. Fruit juice.  Meat and Other Protein Products Meat in general - RED meat & White meat.  Fatty cuts of meat. Ribs, chicken wings, all processed meats as bacon, sausage, bologna, salami, fatback, hot dogs, bratwurst and packaged luncheon meats.  Dairy Whole or 2% milk, cream, half-and-half, and cream cheese. Whole-fat or sweetened yogurt. Full-fat cheeses or blue cheese. Non-dairy creamers and whipped toppings. Processed cheese, cheese spreads, or cheese curds.  Condiments Onion and garlic salt, seasoned salt, table salt, and sea salt. Canned and packaged gravies. Worcestershire sauce. Tartar sauce. Barbecue sauce. Teriyaki sauce. Soy sauce, including reduced sodium. Steak sauce. Fish sauce. Oyster sauce. Cocktail sauce. Horseradish. Ketchup and mustard. Meat flavorings and tenderizers. Bouillon cubes. Hot sauce. Tabasco sauce. Marinades. Taco seasonings. Relishes.  Fats and Oils Butter, stick margarine, lard, shortening and bacon fat. Coconut, palm kernel, or palm oils. Regular salad dressings.  Pickles and olives. Salted popcorn and pretzels.  The items listed above may not be a complete list of foods and beverages to avoid.

## 2017-12-17 LAB — URINALYSIS, ROUTINE W REFLEX MICROSCOPIC
BACTERIA UA: NONE SEEN /HPF
BILIRUBIN URINE: NEGATIVE
Glucose, UA: NEGATIVE
HGB URINE DIPSTICK: NEGATIVE
Hyaline Cast: NONE SEEN /LPF
LEUKOCYTES UA: NEGATIVE
NITRITE: NEGATIVE
SPECIFIC GRAVITY, URINE: 1.029 (ref 1.001–1.03)
SQUAMOUS EPITHELIAL / LPF: NONE SEEN /HPF (ref <=5)
WBC, UA: NONE SEEN /HPF (ref 0–5)
pH: 5.5 (ref 5.0–8.0)

## 2017-12-17 LAB — CBC WITH DIFFERENTIAL/PLATELET
BASOS ABS: 78 {cells}/uL (ref 0–200)
Basophils Relative: 0.9 %
EOS PCT: 1.9 %
Eosinophils Absolute: 165 cells/uL (ref 15–500)
HEMATOCRIT: 46 % (ref 38.5–50.0)
HEMOGLOBIN: 16.2 g/dL (ref 13.2–17.1)
LYMPHS ABS: 2184 {cells}/uL (ref 850–3900)
MCH: 32 pg (ref 27.0–33.0)
MCHC: 35.2 g/dL (ref 32.0–36.0)
MCV: 90.9 fL (ref 80.0–100.0)
MONOS PCT: 7.2 %
MPV: 11.4 fL (ref 7.5–12.5)
NEUTROS ABS: 5646 {cells}/uL (ref 1500–7800)
Neutrophils Relative %: 64.9 %
Platelets: 279 10*3/uL (ref 140–400)
RBC: 5.06 10*6/uL (ref 4.20–5.80)
RDW: 12.1 % (ref 11.0–15.0)
Total Lymphocyte: 25.1 %
WBC mixed population: 626 cells/uL (ref 200–950)
WBC: 8.7 10*3/uL (ref 3.8–10.8)

## 2017-12-17 LAB — VITAMIN B12: Vitamin B-12: 351 pg/mL (ref 200–1100)

## 2017-12-17 LAB — HEMOGLOBIN A1C
HEMOGLOBIN A1C: 5.4 %{Hb} (ref <5.7)
Mean Plasma Glucose: 108 (calc)
eAG (mmol/L): 6 (calc)

## 2017-12-17 LAB — LIPID PANEL
CHOL/HDL RATIO: 3.5 (calc) (ref <5.0)
CHOLESTEROL: 194 mg/dL (ref <200)
HDL: 56 mg/dL (ref >40)
LDL CHOLESTEROL (CALC): 105 mg/dL — AB
Non-HDL Cholesterol (Calc): 138 mg/dL (calc) — ABNORMAL HIGH (ref <130)
TRIGLYCERIDES: 211 mg/dL — AB (ref <150)

## 2017-12-17 LAB — HEPATIC FUNCTION PANEL
AG RATIO: 1.7 (calc) (ref 1.0–2.5)
ALBUMIN MSPROF: 4.8 g/dL (ref 3.6–5.1)
ALT: 19 U/L (ref 9–46)
AST: 14 U/L (ref 10–40)
Alkaline phosphatase (APISO): 64 U/L (ref 40–115)
BILIRUBIN INDIRECT: 0.7 mg/dL (ref 0.2–1.2)
Bilirubin, Direct: 0.2 mg/dL (ref 0.0–0.2)
GLOBULIN: 2.8 g/dL (ref 1.9–3.7)
TOTAL PROTEIN: 7.6 g/dL (ref 6.1–8.1)
Total Bilirubin: 0.9 mg/dL (ref 0.2–1.2)

## 2017-12-17 LAB — BASIC METABOLIC PANEL WITH GFR
BUN: 11 mg/dL (ref 7–25)
CALCIUM: 10.1 mg/dL (ref 8.6–10.3)
CHLORIDE: 102 mmol/L (ref 98–110)
CO2: 32 mmol/L (ref 20–32)
Creat: 0.97 mg/dL (ref 0.60–1.35)
GFR, EST AFRICAN AMERICAN: 119 mL/min/{1.73_m2} (ref >=60)
GFR, Est Non African American: 103 mL/min/{1.73_m2} (ref >=60)
GLUCOSE: 92 mg/dL (ref 65–99)
POTASSIUM: 4.2 mmol/L (ref 3.5–5.3)
Sodium: 142 mmol/L (ref 135–146)

## 2017-12-17 LAB — IRON, TOTAL/TOTAL IRON BINDING CAP
%SAT: 44 % (calc) (ref 15–60)
Iron: 150 ug/dL (ref 50–180)
TIBC: 343 mcg/dL (calc) (ref 250–425)

## 2017-12-17 LAB — TSH: TSH: 1.73 mIU/L (ref 0.40–4.50)

## 2017-12-17 LAB — MICROALBUMIN / CREATININE URINE RATIO
CREATININE, URINE: 519 mg/dL — AB (ref 20–320)
MICROALB UR: 1.6 mg/dL
Microalb Creat Ratio: 3 mcg/mg creat (ref <30)

## 2017-12-17 LAB — INSULIN, RANDOM: Insulin: 4.9 u[IU]/mL (ref 2.0–19.6)

## 2017-12-17 LAB — MAGNESIUM: MAGNESIUM: 2.2 mg/dL (ref 1.5–2.5)

## 2017-12-17 LAB — TESTOSTERONE: TESTOSTERONE: 109 ng/dL — AB (ref 250–827)

## 2017-12-17 LAB — VITAMIN D 25 HYDROXY (VIT D DEFICIENCY, FRACTURES): VIT D 25 HYDROXY: 37 ng/mL (ref 30–100)

## 2017-12-18 LAB — LEAD, BLOOD (ADULT >= 16 YRS): Lead: <1 ug/dL (ref <5)

## 2017-12-19 LAB — TB SKIN TEST
INDURATION: 0 mm
TB SKIN TEST: NEGATIVE

## 2018-06-16 ENCOUNTER — Ambulatory Visit: Payer: BC Managed Care – PPO | Admitting: Internal Medicine

## 2018-06-16 ENCOUNTER — Encounter: Payer: Self-pay | Admitting: Internal Medicine

## 2018-06-16 VITALS — BP 126/84 | HR 84 | Temp 97.5°F | Resp 16 | Ht 72.0 in | Wt 225.8 lb

## 2018-06-16 DIAGNOSIS — R0789 Other chest pain: Secondary | ICD-10-CM | POA: Diagnosis not present

## 2018-06-16 DIAGNOSIS — R0989 Other specified symptoms and signs involving the circulatory and respiratory systems: Secondary | ICD-10-CM | POA: Insufficient documentation

## 2018-06-16 MED ORDER — MELOXICAM 15 MG PO TABS: ORAL_TABLET | ORAL | 1 refills | Status: DC

## 2018-06-16 MED ORDER — PREDNISONE 20 MG PO TABS: ORAL_TABLET | ORAL | 0 refills | Status: DC

## 2018-06-16 NOTE — Progress Notes (Signed)
Patient ID: Timothy Rivers, male   DOB: [birth year], 32 y.o.   MRN: [id number]

## 2018-06-16 NOTE — Progress Notes (Signed)
  Subjective:    Patient ID: Timothy Rivers, male    DOB: 21-Apr-1986, 32 y.o.   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 He reports occasional  anterior & left-sided chest discomfort related to certain positional changes as bending or twisting his torso. Describes mostly as a soreness . Not effort related. No associated palpitations or dyspnea. His job is very physical and requires a lot of bending ans pushing/pulling, etc.   Medication Sig  . gabapentin (NEURONTIN) 600 MG tablet Take 1/2 to 1 tablet 2 to 3 x / day as needed for pain   No Known Allergies   Past Medical History:  Diagnosis Date  . Hernia, umbilical    Review of Systems    10 point systems review negative except as above.    Objective:   Physical Exam  BP 126/84   Pulse 84   Temp (!) 97.5 F (36.4 C)   Resp 16   Ht 6' (1.829 m)   Wt 225 lb 12.8 oz (102.4 kg)   BMI 30.62 kg/m   HEENT - WNL. Neck - supple.  Chest - Clear equal BS. Cor - Nl HS. RRR w/o sig MGR. PP 1(+). No edema. MS- FROM w/o deformities.  Gait Nl. Neuro -  Nl w/o focal abnormalities.    Assessment & Plan:   1. Chest wall pain  - advised 1st take the prednisone   - predniSONE (DELTASONE) 20 MG tablet; 1 tab 3 x day for 3 days, then 1 tab 2 x day for 3 days, then 1 tab 1 x day for 5 days  Dispense: 20 tablet  - then may try the Meloxicam as needed   - meloxicam (MOBIC) 15 MG tablet; Take 1/2 to 1 tablet daily with food for Pain & Inflammation  Dispense: 90 tablet; Refill: 1

## 2018-07-02 ENCOUNTER — Ambulatory Visit: Payer: BC Managed Care – PPO | Admitting: Internal Medicine

## 2018-07-02 ENCOUNTER — Encounter: Payer: Self-pay | Admitting: Internal Medicine

## 2018-07-02 VITALS — BP 128/88 | HR 82 | Ht 73.0 in | Wt 220.6 lb

## 2018-07-02 DIAGNOSIS — R0789 Other chest pain: Secondary | ICD-10-CM

## 2018-07-02 DIAGNOSIS — I456 Pre-excitation syndrome: Secondary | ICD-10-CM | POA: Diagnosis not present

## 2018-07-02 NOTE — Patient Instructions (Addendum)
Medication Instructions:  Your physician recommends that you continue on your current medications as directed. Please refer to the Current Medication list given to you today.  If you need a refill on your cardiac medications before your next appointment, please call your pharmacy.   Lab work: None ordered.  If you have labs (blood work) drawn today and your tests are completely normal, you will receive your results only by: Marland Kitchen MyChart Message (if you have MyChart) OR . A paper copy in the mail If you have any lab test that is abnormal or we need to change your treatment, we will call you to review the results.  Testing/Procedures: Your physician has requested that you have an exercise tolerance test. For further information please visit [email or web address]. Please also follow instruction sheet, as given.  Please schedule for a treadmill stress test.  Follow-Up:  Your physician wants you to follow-up in: based on results of your stress test.  At [facility], you and your health needs are our priority.  As part of our continuing mission to provide you with exceptional heart care, we have created designated Provider Care Teams.  These Care Teams include your primary Cardiologist (physician) and Advanced Practice Providers (APPs -  Physician Assistants and Nurse Practitioners) who all work together to provide you with the care you need, when you need it.  Any Other Special Instructions Will Be Listed Below (If Applicable).

## 2018-07-02 NOTE — Progress Notes (Signed)
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 He is a 32 yo man with a h/o an abnormal ECG, but with no symptoms. I saw him last almost 2 years ago. In the interim, he has not had any symptomatic palpitations. No sob and no syncope. He does note some chest pressure. Non-exertional mostly. It is dull and achy.  No Known Allergies   Current Outpatient Medications  Medication Sig Dispense Refill  . gabapentin (NEURONTIN) 600 MG tablet Take 1/2 to 1 tablet 2 to 3 x / day as needed for pain 270 tablet 1  . meloxicam (MOBIC) 15 MG tablet Take 1/2 to 1 tablet daily with food for Pain & Inflammation 90 tablet 1  . predniSONE (DELTASONE) 20 MG tablet 1 tab 3 x day for 3 days, then 1 tab 2 x day for 3 days, then 1 tab 1 x day for 5 days 20 tablet 0   No current facility-administered medications for this visit.      Past Medical History:  Diagnosis Date  . Hernia, umbilical     ROS:   All systems reviewed and negative except as noted in the HPI.   History reviewed. No pertinent surgical history.   Family History  Problem Relation Age of Onset  . Diabetes Maternal Aunt   . Diabetes Maternal Uncle   . Cancer Maternal Grandfather      Social History   Socioeconomic History  . Marital status: Single    Spouse name: Not on file  . Number of children: Not on file  . Years of education: Not on file  . Highest education level: Not on file  Occupational History  . Not on file  Social Needs  . Financial resource strain: Not on file  . Food insecurity:    Worry: Not on file    Inability: Not on file  . Transportation needs:    Medical: Not on file    Non-medical: Not on file  Tobacco Use  . Smoking status: Current Every Day Smoker    Types: Cigars  . Smokeless tobacco: Never Used  Substance and Sexual Activity  . Alcohol use: Yes    Alcohol/week: 5.0 standard drinks    Types: 5 Cans of beer per week  . Drug use: Yes    Types: Marijuana  . Sexual activity:  Yes  Lifestyle  . Physical activity:    Days per week: Not on file    Minutes per session: Not on file  . Stress: Not on file  Relationships  . Social connections:    Talks on phone: Not on file    Gets together: Not on file    Attends religious service: Not on file    Active member of club or organization: Not on file    Attends meetings of clubs or organizations: Not on file    Relationship status: Not on file  . Intimate partner violence:    Fear of current or ex partner: Not on file    Emotionally abused: Not on file    Physically abused: Not on file    Forced sexual activity: Not on file  Other Topics Concern  . Not on file  Social History Narrative  . Not on file     BP 128/88   Pulse 82   Ht 6\' 1"  (1.854 m)   Wt 220 lb 9.6 oz (100.1 kg)   SpO2 97%   BMI 29.10 kg/m   Physical  Exam:  Well appearing NAD HEENT: Unremarkable Neck:  No JVD, no thyromegally Lymphatics:  No adenopathy Back:  No CVA tenderness Lungs:  Clear with no wheezes HEART:  Regular rate rhythm, no murmurs, no rubs, no clicks Abd:  soft, positive bowel sounds, no organomegally, no rebound, no guarding Ext:  2 plus pulses, no edema, no cyanosis, no clubbing Skin:  No rashes no nodules Neuro:  CN II through XII intact, motor grossly intact  EKG NSR with WPW pattern  Assess/Plan: 1. WPW pattern - he has no symptoms of palpitations. He will undergo watchful waiting. We discussed moderation of ETOH/caffeine consumption. 2. Chest pressure - he has atypical symptoms. I would like him to undergo stress testing.   Timothy Rivers.D.

## 2018-07-17 ENCOUNTER — Ambulatory Visit (INDEPENDENT_AMBULATORY_CARE_PROVIDER_SITE_OTHER): Payer: BC Managed Care – PPO

## 2018-07-17 DIAGNOSIS — R0789 Other chest pain: Secondary | ICD-10-CM

## 2018-07-17 LAB — EXERCISE TOLERANCE TEST
CHL CUP MPHR: 187 {beats}/min
CHL CUP RESTING HR STRESS: 91 {beats}/min
CHL RATE OF PERCEIVED EXERTION: 16
CSEPEDS: 0 s
CSEPEW: 11.7 METS
Exercise duration (min): 10 min
Peak HR: 173 {beats}/min
Percent HR: 92 %

## 2018-07-22 ENCOUNTER — Other Ambulatory Visit: Payer: Self-pay | Admitting: Internal Medicine

## 2018-07-22 DIAGNOSIS — R079 Chest pain, unspecified: Secondary | ICD-10-CM

## 2018-07-22 DIAGNOSIS — R0989 Other specified symptoms and signs involving the circulatory and respiratory systems: Secondary | ICD-10-CM

## 2018-07-23 ENCOUNTER — Telehealth: Payer: Self-pay

## 2018-07-23 NOTE — Telephone Encounter (Signed)
-----   Message from Evans Lance, MD sent at 07/19/2018  8:54 PM EST ----- Negative exercise test

## 2018-07-23 NOTE — Telephone Encounter (Signed)
Call placed to Pt.  Advised of results of stress test.  Per Dr. Lovena Le - follow up in one year unless Pt has breakthrough heart racing.  Pt indicates understanding.

## 2018-08-07 ENCOUNTER — Ambulatory Visit (HOSPITAL_COMMUNITY)
Admission: RE | Admit: 2018-08-07 | Discharge: 2018-08-07 | Disposition: A | Discharge status: Home / Self Care | Payer: BC Managed Care – PPO | Source: Ambulatory Visit | Attending: Internal Medicine | Admitting: Internal Medicine

## 2018-08-07 DIAGNOSIS — R079 Chest pain, unspecified: Secondary | ICD-10-CM | POA: Diagnosis present

## 2018-08-07 DIAGNOSIS — R0989 Other specified symptoms and signs involving the circulatory and respiratory systems: Secondary | ICD-10-CM | POA: Diagnosis not present

## 2018-08-21 ENCOUNTER — Telehealth: Payer: Self-pay | Admitting: *Deleted

## 2018-08-21 NOTE — Telephone Encounter (Signed)
Patient is aware of normal chest x-ray results.

## 2018-11-18 ENCOUNTER — Telehealth: Payer: Self-pay

## 2018-11-18 NOTE — Telephone Encounter (Signed)
Call placed to Pt.  Advised at this time-routine follow up appointments are being cancelled to reduce possible exposure for our patients.  Pt would like televisit.

## 2018-11-20 ENCOUNTER — Ambulatory Visit: Payer: BC Managed Care – PPO | Admitting: Internal Medicine

## 2018-11-20 ENCOUNTER — Telehealth (INDEPENDENT_AMBULATORY_CARE_PROVIDER_SITE_OTHER): Payer: BC Managed Care – PPO | Admitting: Internal Medicine

## 2018-11-20 DIAGNOSIS — R0789 Other chest pain: Secondary | ICD-10-CM | POA: Diagnosis not present

## 2018-11-20 NOTE — Progress Notes (Signed)
Telemedicine call. I was in the office and the patient at home.  HPI Timothy Rivers presents today for a telemedicine visit to discuss his chest pain. He is an otherwise healthy 33 yo man with WPW who has been well controlled. 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 The patient describes "heart pain" which is uncomfortable when he presses on his left chest around the region of his heart. He notes that his chest is sore a day after he has done strenuous acitvity. It is not worse with exertion. When he lies down he will experience chest discomfort if he is in a prone position. No syncope or sob. No nausea or vomiting.   No Known Allergies   Current Outpatient Medications  Medication Sig Dispense Refill  . gabapentin (NEURONTIN) 600 MG tablet Take 1/2 to 1 tablet 2 to 3 x / day as needed for pain 270 tablet 1  . meloxicam (MOBIC) 15 MG tablet Take 1/2 to 1 tablet daily with food for Pain & Inflammation 90 tablet 1  . predniSONE (DELTASONE) 20 MG tablet 1 tab 3 x day for 3 days, then 1 tab 2 x day for 3 days, then 1 tab 1 x day for 5 days 20 tablet 0   No current facility-administered medications for this visit.      Past Medical History:  Diagnosis Date  . Hernia, umbilical     ROS:   All systems reviewed and negative except as noted in the HPI.   No past surgical history on file.   Family History  Problem Relation Age of Onset  . Diabetes Maternal Aunt   . Diabetes Maternal Uncle   . Cancer Maternal Grandfather      Social History   Socioeconomic History  . Marital status: Single    Spouse name: Not on file  . Number of children: Not on file  . Years of education: Not on file  . Highest education level: Not on file  Occupational History  . Not on file  Social Needs  . Financial resource strain: Not on file  . Food insecurity:    Worry: Not on file    Inability: Not on file  . Transportation needs:    Medical: Not on file    Non-medical:  Not on file  Tobacco Use  . Smoking status: Current Every Day Smoker    Types: Cigars  . Smokeless tobacco: Never Used  Substance and Sexual Activity  . Alcohol use: Yes    Alcohol/week: 5.0 standard drinks    Types: 5 Cans of beer per week  . Drug use: Yes    Types: Marijuana  . Sexual activity: Yes  Lifestyle  . Physical activity:    Days per week: Not on file    Minutes per session: Not on file  . Stress: Not on file  Relationships  . Social connections:    Talks on phone: Not on file    Gets together: Not on file    Attends religious service: Not on file    Active member of club or organization: Not on file    Attends meetings of clubs or organizations: Not on file    Relationship status: Not on file  . Intimate partner violence:    Fear of current or ex partner: Not on file    Emotionally abused: Not on file    Physically abused: Not on file    Forced sexual activity: Not  on file  Other Topics Concern  . Not on file  Social History Narrative  . Not on file     Assess/Plan: 1. Non-cardiac chest pain - I discussed the benign nature of the pain with the patient. He has musculoskeletal symptoms. See above. I advised that he be considered for referral to a rheumatologist. 2. WPW - his chest pain is not associated with palpitations or the sensation that his heart is racing. He will undergo watchful waiting.  Mikle Bosworth.D.

## 2019-01-08 ENCOUNTER — Ambulatory Visit: Payer: BC Managed Care – PPO | Admitting: Internal Medicine

## 2019-01-08 ENCOUNTER — Encounter: Payer: Self-pay | Admitting: Internal Medicine

## 2019-01-08 ENCOUNTER — Other Ambulatory Visit: Payer: Self-pay

## 2019-01-08 VITALS — BP 122/74 | HR 72 | Temp 97.2°F | Resp 16 | Ht 72.0 in | Wt 237.4 lb

## 2019-01-08 DIAGNOSIS — Z79899 Other long term (current) drug therapy: Secondary | ICD-10-CM

## 2019-01-08 DIAGNOSIS — M1000 Idiopathic gout, unspecified site: Secondary | ICD-10-CM | POA: Diagnosis not present

## 2019-01-08 LAB — CBC WITH DIFFERENTIAL/PLATELET
Absolute Monocytes: 529 cells/uL (ref 200–950)
Basophils Absolute: 101 cells/uL (ref 0–200)
Basophils Relative: 1.5 %
Eosinophils Absolute: 188 cells/uL (ref 15–500)
Eosinophils Relative: 2.8 %
HCT: 46 % (ref 38.5–50.0)
Hemoglobin: 16 g/dL (ref 13.2–17.1)
Lymphs Abs: 1628 cells/uL (ref 850–3900)
MCH: 31.8 pg (ref 27.0–33.0)
MCHC: 34.8 g/dL (ref 32.0–36.0)
MCV: 91.5 fL (ref 80.0–100.0)
MPV: 11.4 fL (ref 7.5–12.5)
Monocytes Relative: 7.9 %
Neutro Abs: 4255 cells/uL (ref 1500–7800)
Neutrophils Relative %: 63.5 %
Platelets: 263 10*3/uL (ref 140–400)
RBC: 5.03 10*6/uL (ref 4.20–5.80)
RDW: 12.1 % (ref 11.0–15.0)
Total Lymphocyte: 24.3 %
WBC: 6.7 10*3/uL (ref 3.8–10.8)

## 2019-01-08 LAB — URIC ACID: Uric Acid, Serum: 8.1 mg/dL — ABNORMAL HIGH (ref 4.0–8.0)

## 2019-01-08 MED ORDER — PREDNISONE 20 MG PO TABS: ORAL_TABLET | ORAL | 1 refills | Status: DC

## 2019-01-08 NOTE — Patient Instructions (Signed)
Gout  Gout is a condition that causes painful swelling of the joints. Gout is a type of inflammation of the joints (arthritis). This condition is caused by having too much uric acid in the body. Uric acid is a chemical that forms when the body breaks down substances called purines. Purines are important for building body proteins. When the body has too much uric acid, sharp crystals can form and build up inside the joints. This causes pain and swelling. Gout attacks can happen quickly and may be very painful (acute gout). Over time, the attacks can affect more joints and become more frequent (chronic gout). Gout can also cause uric acid to build up under the skin and inside the kidneys. What are the causes? This condition is caused by too much uric acid in your blood. This can happen because:  Your kidneys do not remove enough uric acid from your blood. This is the most common cause.  Your body makes too much uric acid. This can happen with some cancers and cancer treatments. It can also occur if your body is breaking down too many red blood cells (hemolytic anemia).  You eat too many foods that are high in purines. These foods include organ meats and some seafood. Alcohol, especially beer, is also high in purines. A gout attack may be triggered by trauma or stress. What increases the risk? You are more likely to develop this condition if you:  Have a family history of gout.  Are male and middle-aged.  Are male and have gone through menopause.  Are obese.  Frequently drink alcohol, especially beer.  Are dehydrated.  Lose weight too quickly.  Have an organ transplant.  Have lead poisoning.  Take certain medicines, including aspirin, cyclosporine, diuretics, levodopa, and niacin.  Have kidney disease.  Have a skin condition called psoriasis. What are the signs or symptoms? An attack of acute gout happens quickly. It usually occurs in just one joint. The most common place is  the big toe. Attacks often start at night. Other joints that may be affected include joints of the feet, ankle, knee, fingers, wrist, or elbow. Symptoms of this condition may include:  Severe pain.  Warmth.  Swelling.  Stiffness.  Tenderness. The affected joint may be very painful to touch.  Shiny, red, or purple skin.  Chills and fever. Chronic gout may cause symptoms more frequently. More joints may be involved. You may also have white or yellow lumps (tophi) on your hands or feet or in other areas near your joints. How is this diagnosed? This condition is diagnosed based on your symptoms, medical history, and physical exam. You may have tests, such as:  Blood tests to measure uric acid levels.  Removal of joint fluid with a thin needle (aspiration) to look for uric acid crystals.  X-rays to look for joint damage. How is this treated? Treatment for this condition has two phases: treating an acute attack and preventing future attacks. Acute gout treatment may include medicines to reduce pain and swelling, including:  NSAIDs.  Steroids. These are strong anti-inflammatory medicines that can be taken by mouth (orally) or injected into a joint.  Colchicine. This medicine relieves pain and swelling when it is taken soon after an attack. It can be given by mouth or through an IV. Preventive treatment may include:  Daily use of smaller doses of NSAIDs or colchicine.  Use of a medicine that reduces uric acid levels in your blood.  Changes to your diet. You may   need to see a dietitian about what to eat and drink to prevent gout. Follow these instructions at home: During a gout attack   If directed, put ice on the affected area: ? Put ice in a plastic bag. ? Place a towel between your skin and the bag. ? Leave the ice on for 20 minutes, 2-3 times a day.  Raise (elevate) the affected joint above the level of your heart as often as possible.  Rest the joint as much as possible.  If the affected joint is in your leg, you may be given crutches to use.  Follow instructions from your health care provider about eating or drinking restrictions. Avoiding future gout attacks  Follow a low-purine diet as told by your dietitian or health care provider. Avoid foods and drinks that are high in purines, including liver, kidney, anchovies, asparagus, herring, mushrooms, mussels, and beer.  Maintain a healthy weight or lose weight if you are overweight. If you want to lose weight, talk with your health care provider. It is important that you do not lose weight too quickly.  Start or maintain an exercise program as told by your health care provider. Eating and drinking  Drink enough fluids to keep your urine pale yellow.  If you drink alcohol: ? Limit how much you use to:  0-1 drink a day for women.  0-2 drinks a day for men. ? Be aware of how much alcohol is in your drink. In the [place] one drink equals one 12 oz bottle of beer (355 mL) one 5 oz glass of wine (148 mL), or one 1 oz glass of hard liquor (44 mL). General instructions  Take over-the-counter and prescription medicines only as told by your health care provider.  Do not drive or use heavy machinery while taking prescription pain medicine.  Return to your normal activities as told by your health care provider. Ask your health care provider what activities are safe for you.  Keep all follow-up visits as told by your health care provider. This is important. Contact a health care provider if you have:  Another gout attack.  Continuing symptoms of a gout attack after 10 days of treatment.  Side effects from your medicines.  Chills or a fever.  Burning pain when you urinate.  Pain in your lower back or belly. Get help right away if you:  Have severe or uncontrolled pain.  Cannot urinate. Summary  Gout is painful swelling of the joints caused by inflammation.  The most common site of pain is the big  toe, but it can affect other joints in the body.  Medicines and dietary changes can help to prevent and treat gout attacks. =============================   Low-Purine Eating Plan A low-purine eating plan involves making food choices to limit your intake of purine. Purine is a kind of uric acid. Too much uric acid in your blood can cause certain conditions, such as gout and kidney stones. Eating a low-purine diet can help control these conditions. What are tips for following this plan? Reading food labels   Avoid foods with saturated or Trans fat.  Check the ingredient list of grains-based foods, such as bread and cereal, to make sure that they contain whole grains.  Check the ingredient list of sauces or soups to make sure they do not contain meat or fish.  When choosing soft drinks, check the ingredient list to make sure they do not contain high-fructose corn syrup. Shopping  Buy plenty of fresh fruits and  vegetables.  Avoid buying canned or fresh fish.  Buy dairy products labeled as low-fat or nonfat.  Avoid buying premade or processed foods. These foods are often high in fat, salt (sodium), and added sugar. Cooking  Use olive oil instead of butter when cooking. Oils like olive oil, canola oil, and sunflower oil contain healthy fats. Meal planning  Learn which foods do or do not affect you. If you find out that a food tends to cause your gout symptoms to flare up, avoid eating that food. You can enjoy foods that do not cause problems. If you have any questions about a food item, talk with your dietitian or health care provider.  Limit foods high in fat, especially saturated fat. Fat makes it harder for your body to get rid of uric acid.  Choose foods that are lower in fat and are lean sources of protein. General guidelines  Limit alcohol intake to no more than 1 drink a day for nonpregnant women and 2 drinks a day for men. One drink equals 12 oz of beer, 5 oz of wine, or 1  oz of hard liquor. Alcohol can affect the way your body gets rid of uric acid.  Drink plenty of water to keep your urine clear or pale yellow. Fluids can help remove uric acid from your body.  If directed by your health care provider, take a vitamin C supplement.  Work with your health care provider and dietitian to develop a plan to achieve or maintain a healthy weight. Losing weight can help reduce uric acid in your blood. What foods are recommended? The items listed may not be a complete list. Talk with your dietitian about what dietary choices are best for you. Foods low in purines Foods low in purines do not need to be limited. These include:  All fruits.  All low-purine vegetables, pickles, and olives.  Breads, pasta, rice, cornbread, and popcorn. Cake and other baked goods.  All dairy foods.  Eggs, nuts, and nut butters.  Spices and condiments, such as salt, herbs, and vinegar.  Plant oils, butter, and margarine.  Water, sugar-free soft drinks, tea, coffee, and cocoa.  Vegetable-based soups, broths, sauces, and gravies. Foods moderate in purines Foods moderate in purines should be limited to the amounts listed.   cup of asparagus, cauliflower, spinach, mushrooms, or green peas, each day.  2/3 cup uncooked oatmeal, each day.   cup dry wheat bran or wheat germ, each day.  2-3 ounces of meat or poultry, each day.  4-6 ounces of shellfish, such as crab, lobster, oysters, or shrimp, each day.  1 cup cooked beans, peas, or lentils, each day.  Soup, broths, or bouillon made from meat or fish. Limit these foods as much as possible. What foods are not recommended? The items listed may not be a complete list. Talk with your dietitian about what dietary choices are best for you. Limit your intake of foods high in purines, including:  Beer and other alcohol.  Meat-based gravy or sauce.  Canned or fresh fish, such as: ? Anchovies, sardines, herring, and tuna. ?  Mussels and scallops. ? Codfish, trout, and haddock.  Berniece Salines.  Organ meats, such as: ? Liver or kidney. ? Tripe. ? Sweetbreads (thymus gland or pancreas).  Wild [employer] biochemist.  Yeast or yeast extract supplements.  Drinks sweetened with high-fructose corn syrup. Summary  Eating a low-purine diet can help control conditions caused by too much uric acid in the body, such as gout or  kidney stones.  Choose low-purine foods, limit alcohol, and limit foods high in fat.  You will learn over time which foods do or do not affect you. If you find out that a food tends to cause your gout symptoms to flare up, avoid eating that food. This information is not intended to replace advice given to you by your health care provider. Make sure you discuss any questions you have with your health care provider. Document Released: 12/15/2010 Document Revised: 10/03/2016 Document Reviewed: 10/03/2016 Elsevier Interactive Patient Education  2019 [employer].

## 2019-01-08 NOTE — Progress Notes (Signed)
  Subjective:    Patient ID: Timothy Rivers, male    DOB: 1986/06/23, 33 y.o.   MRN: [id number]  HPI   This nice 33 yo MWM presents with concerns of possible Gout in his Lt 1st toe for the last 7-10 days. He denies hx/o trauma.  Medication Sig  . gabapentin  600 MG tablet Take 1/2 to 1 tablet 2 to 3 x / day as needed for pain  . meloxicam  15 MG tablet Take 1/2 to 1 tablet daily with food for Pain & Inflammation  . predniSONE 20 MG tablet 1 tab 3 x day for 3 days, then 1 tab 2 x day for 3 days, then 1 tab 1 x day for 5 days   No Known Allergies   Past Medical History:  Diagnosis Date  . Hernia, umbilical    Review of Systems     10 point systems review negative except as above.    Objective:   Physical Exam  BP 122/74   Pulse 72   Temp (!) 97.2 F (36.2 C)   Resp 16   Ht 6' (1.829 m)   Wt 237 lb 6.4 oz (107.7 kg)   BMI 32.20 kg/m   HEENT - WNL. Neck - supple.  Chest - Clear equal BS. Cor - Nl HS. RRR w/o sig MGR. PP 1(+). No edema. MS- FROM w/o deformities.  Moderate point tenderness of the Right 1st MTP joint. Gait Nl. Neuro -  Nl w/o focal abnormalities.    Assessment & Plan:   1. Acute idiopathic gout, suspected  - CBC with Differential/Platelet - Uric acid - predniSONE 20 MG tablet; 1 tab 3 x day for 3 days, then 1 tab 2 x day for 3 days, then 1 tab 1 x day for 5 days  Dispense: 20 tablet; Refill: 1  2. Medication management  - CBC with Differential/Platelet - Uric acid

## 2019-01-09 ENCOUNTER — Other Ambulatory Visit: Payer: Self-pay | Admitting: Internal Medicine

## 2019-01-09 DIAGNOSIS — M1000 Idiopathic gout, unspecified site: Secondary | ICD-10-CM

## 2019-01-09 DIAGNOSIS — Z79899 Other long term (current) drug therapy: Secondary | ICD-10-CM

## 2019-01-09 DIAGNOSIS — Z77011 Contact with and (suspected) exposure to lead: Secondary | ICD-10-CM

## 2019-01-09 MED ORDER — ALLOPURINOL 300 MG PO TABS: ORAL_TABLET | ORAL | 3 refills | Status: DC

## 2019-01-11 ENCOUNTER — Encounter: Payer: Self-pay | Admitting: Internal Medicine

## 2019-01-21 ENCOUNTER — Encounter: Payer: Self-pay | Admitting: Internal Medicine

## 2019-02-16 ENCOUNTER — Encounter: Payer: Self-pay | Admitting: Internal Medicine

## 2019-04-13 ENCOUNTER — Other Ambulatory Visit: Payer: Self-pay | Admitting: Internal Medicine

## 2019-04-13 DIAGNOSIS — R079 Chest pain, unspecified: Secondary | ICD-10-CM

## 2019-04-13 DIAGNOSIS — M940 Chondrocostal junction syndrome [Tietze]: Secondary | ICD-10-CM

## 2019-04-13 DIAGNOSIS — K1379 Other lesions of oral mucosa: Secondary | ICD-10-CM

## 2019-04-13 MED ORDER — VALACYCLOVIR HCL 1 G PO TABS: ORAL_TABLET | ORAL | 3 refills | Status: DC

## 2019-06-18 ENCOUNTER — Encounter: Payer: Self-pay | Admitting: Internal Medicine

## 2019-07-23 ENCOUNTER — Ambulatory Visit: Payer: BC Managed Care – PPO | Admitting: Internal Medicine

## 2019-07-23 ENCOUNTER — Encounter: Payer: Self-pay | Admitting: Internal Medicine

## 2019-07-23 ENCOUNTER — Other Ambulatory Visit: Payer: Self-pay

## 2019-07-23 VITALS — BP 134/84 | HR 64 | Temp 97.9°F | Resp 16 | Ht 73.0 in | Wt 239.6 lb

## 2019-07-23 DIAGNOSIS — R7309 Other abnormal glucose: Secondary | ICD-10-CM

## 2019-07-23 DIAGNOSIS — Z0001 Encounter for general adult medical examination with abnormal findings: Secondary | ICD-10-CM

## 2019-07-23 DIAGNOSIS — I10 Essential (primary) hypertension: Secondary | ICD-10-CM | POA: Diagnosis not present

## 2019-07-23 DIAGNOSIS — M1000 Idiopathic gout, unspecified site: Secondary | ICD-10-CM | POA: Diagnosis not present

## 2019-07-23 DIAGNOSIS — Z1329 Encounter for screening for other suspected endocrine disorder: Secondary | ICD-10-CM

## 2019-07-23 DIAGNOSIS — E559 Vitamin D deficiency, unspecified: Secondary | ICD-10-CM

## 2019-07-23 DIAGNOSIS — Z136 Encounter for screening for cardiovascular disorders: Secondary | ICD-10-CM | POA: Diagnosis not present

## 2019-07-23 DIAGNOSIS — Z111 Encounter for screening for respiratory tuberculosis: Secondary | ICD-10-CM | POA: Diagnosis not present

## 2019-07-23 DIAGNOSIS — E782 Mixed hyperlipidemia: Secondary | ICD-10-CM

## 2019-07-23 DIAGNOSIS — R030 Elevated blood-pressure reading, without diagnosis of hypertension: Secondary | ICD-10-CM

## 2019-07-23 DIAGNOSIS — Z13220 Encounter for screening for lipoid disorders: Secondary | ICD-10-CM

## 2019-07-23 DIAGNOSIS — Z79899 Other long term (current) drug therapy: Secondary | ICD-10-CM

## 2019-07-23 DIAGNOSIS — Z1389 Encounter for screening for other disorder: Secondary | ICD-10-CM

## 2019-07-23 DIAGNOSIS — Z1211 Encounter for screening for malignant neoplasm of colon: Secondary | ICD-10-CM

## 2019-07-23 DIAGNOSIS — Z131 Encounter for screening for diabetes mellitus: Secondary | ICD-10-CM

## 2019-07-23 DIAGNOSIS — Z0000 Encounter for general adult medical examination without abnormal findings: Secondary | ICD-10-CM | POA: Diagnosis not present

## 2019-07-23 DIAGNOSIS — K219 Gastro-esophageal reflux disease without esophagitis: Secondary | ICD-10-CM

## 2019-07-23 DIAGNOSIS — R0989 Other specified symptoms and signs involving the circulatory and respiratory systems: Secondary | ICD-10-CM

## 2019-07-23 DIAGNOSIS — R5383 Other fatigue: Secondary | ICD-10-CM

## 2019-07-23 MED ORDER — FAMOTIDINE 40 MG PO TABS: ORAL_TABLET | ORAL | 3 refills | Status: DC

## 2019-07-23 MED ORDER — PREDNISONE 20 MG PO TABS: ORAL_TABLET | ORAL | 1 refills | Status: DC

## 2019-07-23 NOTE — Patient Instructions (Signed)
Vit D  & Vit C 1,000 mg   are recommended to help protect  against the Covid-19 and other Corona viruses.    Also it's recommended  to take  Zinc 50 mg  to help  protect against the Covid-19   and best place to get  is also on [email or web address][email or web address]  and don't pay more than 6-8 cents /pill !  ================================= Coronavirus (COVID-19) Are you at risk?  Are you at risk for the Coronavirus (COVID-19)?  To be considered HIGH RISK for Coronavirus (COVID-19), you have to meet the following criteria:  . Traveled to [place], [place], [place], [place] or [place]; or in the [place] to [place], [place], [place]  . or [place]; and have fever, cough, and shortness of breath within the last 2 weeks of travel OR . Been in close contact with a person diagnosed with COVID-19 within the last 2 weeks and have  . fever, cough,and shortness of breath .  . IF YOU DO NOT MEET THESE CRITERIA, YOU ARE CONSIDERED LOW RISK FOR COVID-19.  What to do if you are HIGH RISK for COVID-19?  Marland Kitchen If you are having a medical emergency, call 911. . Seek medical care right away. Before you go to a doctor's office, urgent care or emergency department, .  call ahead and tell them about your recent travel, contact with someone diagnosed with COVID-19  .  and your symptoms.  . You should receive instructions from your physician's office regarding next steps of care.  . When you arrive at healthcare provider, tell the healthcare staff immediately you have returned from  . visiting [place], [place], [place], [place] or [place]; or traveled in the [place] to [place], [place],  . [place] or [place] in the last two weeks or you have been in close contact with a person diagnosed with  . COVID-19 in the last 2 weeks.   . Tell the health care staff about your symptoms: fever, cough and shortness of breath. . After you have been seen by a medical provider, you will be either: o Tested for (COVID-19)  and discharged home on quarantine except to seek medical care if  o symptoms worsen, and asked to  - Stay home and avoid contact with others until you get your results (4-5 days)  - Avoid travel on public transportation if possible (such as bus, train, or airplane) or o Sent to the Emergency Department by EMS for evaluation, COVID-19 testing  and  o possible admission depending on your condition and test results.  What to do if you are LOW RISK for COVID-19?  Reduce your risk of any infection by using the same precautions used for avoiding the common cold or flu:  Marland Kitchen Wash your hands often with soap and warm water for at least 20 seconds.  If soap and water are not readily available,  . use an alcohol-based hand sanitizer with at least 60% alcohol.  . If coughing or sneezing, cover your mouth and nose by coughing or sneezing into the elbow areas of your shirt or coat, .  into a tissue or into your sleeve (not your hands). . Avoid shaking hands with others and consider head nods or verbal greetings only. . Avoid touching your eyes, nose, or mouth with unwashed hands.  . Avoid close contact with people who are sick. . Avoid places or events with large numbers of people in one location, like concerts or sporting events. Marland Kitchen  Carefully consider travel plans you have or are making. . If you are planning any travel outside or inside the [place], visit the CDC's Travelers' Health webpage for the latest health notices. . If you have some symptoms but not all symptoms, continue to monitor at home and seek medical attention  . if your symptoms worsen. . If you are having a medical emergency, call 911. >>>>>>>>>>>>>>>>>>>>>>>> Preventive Care for Adults  A healthy lifestyle and preventive care can promote health and wellness. Preventive health guidelines for men include the following key practices:  A routine yearly physical is a good way to check with your health care provider about your health and  preventative screening. It is a chance to share any concerns and updates on your health and to receive a thorough exam.  Visit your dentist for a routine exam and preventative care every 6 months. Brush your teeth twice a day and floss once a day. Good oral hygiene prevents tooth decay and gum disease.  The frequency of eye exams is based on your age, health, family medical history, use of contact lenses, and other factors. Follow your health care provider's recommendations for frequency of eye exams.  Eat a healthy diet. Foods such as vegetables, fruits, whole grains, low-fat dairy products, and lean protein foods contain the nutrients you need without too many calories. Decrease your intake of foods high in solid fats, added sugars, and salt. Eat the right amount of calories for you. Get information about a proper diet from your health care provider, if necessary.  Regular physical exercise is one of the most important things you can do for your health. Most adults should get at least 150 minutes of moderate-intensity exercise (any activity that increases your heart rate and causes you to sweat) each week. In addition, most adults need muscle-strengthening exercises on 2 or more days a week.  Maintain a healthy weight. The body mass index (BMI) is a screening tool to identify possible weight problems. It provides an estimate of body fat based on height and weight. Your health care provider can find your BMI and can help you achieve or maintain a healthy weight. For adults 20 years and older:  A BMI below 18.5 is considered underweight.  A BMI of 18.5 to 24.9 is normal.  A BMI of 25 to 29.9 is considered overweight.  A BMI of 30 and above is considered obese.  Maintain normal blood lipids and cholesterol levels by exercising and minimizing your intake of saturated fat. Eat a balanced diet with plenty of fruit and vegetables. Blood tests for lipids and cholesterol should begin at age 62 and be  repeated every 5 years. If your lipid or cholesterol levels are high, you are over 50, or you are at high risk for heart disease, you may need your cholesterol levels checked more frequently. Ongoing high lipid and cholesterol levels should be treated with medicines if diet and exercise are not working.  If you smoke, find out from your health care provider how to quit. If you do not use tobacco, do not start.  Lung cancer screening is recommended for adults aged 75-80 years who are at high risk for developing lung cancer because of a history of smoking. A yearly low-dose CT scan of the lungs is recommended for people who have at least a 30-pack-year history of smoking and are a current smoker or have quit within the past 15 years. A pack year of smoking is smoking an average of 1  pack of cigarettes a day for 1 year (for example: 1 pack a day for 30 years or 2 packs a day for 15 years). Yearly screening should continue until the smoker has stopped smoking for at least 15 years. Yearly screening should be stopped for people who develop a health problem that would prevent them from having lung cancer treatment.  If you choose to drink alcohol, do not have more than 2 drinks per day. One drink is considered to be 12 ounces (355 mL) of beer, 5 ounces (148 mL) of wine, or 1.5 ounces (44 mL) of liquor.  High blood pressure causes heart disease and increases the risk of stroke. Your blood pressure should be checked. Ongoing high blood pressure should be treated with medicines, if weight loss and exercise are not effective.  If you are 45-79 years old, ask your health care provider if you should take aspirin to prevent heart disease.  Diabetes screening involves taking a blood sample to check your fasting blood sugar level. Testing should be considered at a younger age or be carried out more frequently if you are overweight and have at least 1 risk factor for diabetes.  Colorectal cancer can be detected and  often prevented. Most routine colorectal cancer screening begins at the age of 50 and continues through age 75. However, your health care provider may recommend screening at an earlier age if you have risk factors for colon cancer. On a yearly basis, your health care provider may provide home test kits to check for hidden blood in the stool. Use of a small camera at the end of a tube to directly examine the colon (sigmoidoscopy or colonoscopy) can detect the earliest forms of colorectal cancer. Talk to your health care provider about this at age 50, when routine screening begins. Direct exam of the colon should be repeated every 5-10 years through age 75, unless early forms of precancerous polyps or small growths are found.  Screening for abdominal aortic aneurysm (AAA)  are recommended for persons over age 50 who have history of hypertensionor who are current or former smokers.  Talk with your health care provider about prostate cancer screening.  Testicular cancer screening is recommended for adult males. Screening includes self-exam, a health care provider exam, and other screening tests. Consult with your health care provider about any symptoms you have or any concerns you have about testicular cancer.  Use sunscreen. Apply sunscreen liberally and repeatedly throughout the day. You should seek shade when your shadow is shorter than you. Protect yourself by wearing long sleeves, pants, a wide-brimmed hat, and sunglasses year round, whenever you are outdoors.  Once a month, do a whole-body skin exam, using a mirror to look at the skin on your back. Tell your health care provider about new moles, moles that have irregular borders, moles that are larger than a pencil eraser, or moles that have changed in shape or color.  Stay current with required vaccines (immunizations).  Influenza vaccine. All adults should be immunized every year.  Tetanus, diphtheria, and acellular pertussis (Td, Tdap) vaccine.  An adult who has not previously received Tdap or who does not know his vaccine status should receive 1 dose of Tdap. This initial dose should be followed by tetanus and diphtheria toxoids (Td) booster doses every 10 years. Adults with an unknown or incomplete history of completing a 3-dose immunization series with Td-containing vaccines should begin or complete a primary immunization series including a Tdap dose. Adults should receive a   Td booster every 10 years.  Zoster vaccine. One dose is recommended for adults aged 69 years or older unless certain conditions are present.    Pneumococcal 13-valent conjugate (PCV13) vaccine. When indicated, a person who is uncertain of his immunization history and has no record of immunization should receive the PCV13 vaccine. An adult aged 11 years or older who has certain medical conditions and has not been previously immunized should receive 1 dose of PCV13 vaccine. This PCV13 should be followed with a dose of pneumococcal polysaccharide (PPSV23) vaccine. The PPSV23 vaccine dose should be obtained at least 8 weeks after the dose of PCV13 vaccine. An adult aged 41 years or older who has certain medical conditions and previously received 1 or more doses of PPSV23 vaccine should receive 1 dose of PCV13. The PCV13 vaccine dose should be obtained 1 or more years after the last PPSV23 vaccine dose.    Pneumococcal polysaccharide (PPSV23) vaccine. When PCV13 is also indicated, PCV13 should be obtained first. All adults aged 90 years and older should be immunized. An adult younger than age 67 years who has certain medical conditions should be immunized. Any person who resides in a nursing home or long-term care facility should be immunized. An adult smoker should be immunized. People with an immunocompromised condition and certain other conditions should receive both PCV13 and PPSV23 vaccines. People with human immunodeficiency virus (HIV) infection should be immunized as  soon as possible after diagnosis. Immunization during chemotherapy or radiation therapy should be avoided. Routine use of PPSV23 vaccine is not recommended for American Indians, [place] Natives, or people younger than 65 years unless there are medical conditions that require PPSV23 vaccine. When indicated, people who have unknown immunization and have no record of immunization should receive PPSV23 vaccine. One-time revaccination 5 years after the first dose of PPSV23 is recommended for people aged 19-64 years who have chronic kidney failure, nephrotic syndrome, asplenia, or immunocompromised conditions. People who received 1-2 doses of PPSV23 before age 57 years should receive another dose of PPSV23 vaccine at age 29 years or later if at least 5 years have passed since the previous dose. Doses of PPSV23 are not needed for people immunized with PPSV23 at or after age 48 years.  Hepatitis A vaccine. Adults who wish to be protected from this disease, have certain high-risk conditions, work with hepatitis A-infected animals, work in hepatitis A research labs, or travel to or work in countries with a high rate of hepatitis A should be immunized. Adults who were previously unvaccinated and who anticipate close contact with an international adoptee during the first 60 days after arrival in the [place] from a country with a high rate of hepatitis A should be immunized.  Hepatitis B vaccine. Adults should be immunized if they wish to be protected from this disease, have certain high-risk conditions, may be exposed to blood or other infectious body fluids, are household contacts or sex partners of hepatitis B positive people, are clients or workers in certain care facilities, or travel to or work in countries with a high rate of hepatitis B.  Preventive Service / Frequency  Ages 40 to 19  Blood pressure check.  Lipid and cholesterol check.  Hepatitis C blood test.** / For any individual with known risks  for hepatitis C.  Skin self-exam. / Monthly.  Influenza vaccine. / Every year.  Tetanus, diphtheria, and acellular pertussis (Tdap, Td) vaccine.** / Consult your health care provider. 1 dose of Td every 10 years.  HPV vaccine. / 3 doses over 6 months, if 43 or younger.  Measles, mumps, rubella (MMR) vaccine.** / You need at least 1 dose of MMR if you were born in 1957 or later. You may also need a second dose.  Pneumococcal 13-valent conjugate (PCV13) vaccine.** / Consult your health care provider.  Pneumococcal polysaccharide (PPSV23) vaccine.** / 1 to 2 doses if you smoke cigarettes or if you have certain conditions.  Meningococcal vaccine.** / 1 dose if you are age 35 to 96 years and a Market researcher living in a residence hall, or have one of several medical conditions. You may also need additional booster doses.  Hepatitis A vaccine.** / Consult your health care provider.  Hepatitis B vaccine.** / Consult your health care provider. +++++++++ Recommend Adult Low Dose Aspirin or  coated  Aspirin 81 mg daily  To reduce risk of Colon Cancer 40 %,  Skin Cancer 26 % ,  Melanoma 46%  and  Pancreatic cancer 60% ++++++++++++++++++ Vitamin D goal  is between 70-100.  Please make sure that you are taking your Vitamin D as directed.  It is very important as a natural anti-inflammatory  helping hair, skin, and nails, as well as reducing stroke and heart attack risk.  It helps your bones and helps with mood. It also decreases numerous cancer risks so please take it as directed.  Low Vit D is associated with a 200-300% higher risk for CANCER  and 200-300% higher risk for HEART   ATTACK  &  STROKE.   .....................................Marland Kitchen It is also associated with higher death rate at younger ages,  autoimmune diseases like Rheumatoid arthritis, Lupus, Multiple Sclerosis.    Also many other serious conditions, like depression, Alzheimer's Dementia, infertility, muscle  aches, fatigue, fibromyalgia - just to name a few. +++++++++++++++++++++ Recommend the book "The END of DIETING" by Dr Excell Seltzer  & the book "The END of DIABETES " by Dr Excell Seltzer At [facility].com - get book & Audio CD's    Being diabetic has a  300% increased risk for heart attack, stroke, cancer, and alzheimer- type vascular dementia. It is very important that you work harder with diet by avoiding all foods that are white. Avoid white rice (brown & wild rice is OK), white potatoes (sweetpotatoes in moderation is OK), White bread or wheat bread or anything made out of white flour like bagels, donuts, rolls, buns, biscuits, cakes, pastries, cookies, pizza crust, and pasta (made from white flour & egg whites) - vegetarian pasta or spinach or wheat pasta is OK. Multigrain breads like Arnold's or [employer], or multigrain sandwich thins or flatbreads.  Diet, exercise and weight loss can reverse and cure diabetes in the early stages.  Diet, exercise and weight loss is very important in the control and prevention of complications of diabetes which affects every system in your body, ie. Brain - dementia/stroke, eyes - glaucoma/blindness, heart - heart attack/heart failure, kidneys - dialysis, stomach - gastric paralysis, intestines - malabsorption, nerves - severe painful neuritis, circulation - gangrene & loss of a leg(s), and finally cancer and Alzheimers.    I recommend avoid fried & greasy foods,  sweets/candy, white rice (brown or wild rice or Quinoa is OK), white potatoes (sweet potatoes are OK) - anything made from white flour - bagels, doughnuts, rolls, buns, biscuits,white and wheat breads, pizza crust and traditional pasta made of white flour & egg white(vegetarian pasta or spinach or wheat pasta is OK).  Multi-grain bread is OK -  like multi-grain flat bread or sandwich thins. Avoid alcohol in excess. Exercise is also important.    Eat all the vegetables you want - avoid meat, especially red  meat and dairy - especially cheese.  Cheese is the most concentrated form of trans-fats which is the worst thing to clog up our arteries. Veggie cheese is OK which can be found in the fresh produce section at [employer] or [employer] or [employer]  +++++++++++++++++++ DASH Eating Plan  DASH stands for "Dietary Approaches to Stop Hypertension."   The DASH eating plan is a healthy eating plan that has been shown to reduce high blood pressure (hypertension). Additional health benefits may include reducing the risk of type 2 diabetes mellitus, heart disease, and stroke. The DASH eating plan may also help with weight loss. WHAT DO I NEED TO KNOW ABOUT THE DASH EATING PLAN? For the DASH eating plan, you will follow these general guidelines:  Choose foods with a percent daily value for sodium of less than 5% (as listed on the food label).  Use salt-free seasonings or herbs instead of table salt or sea salt.  Check with your health care provider or pharmacist before using salt substitutes.  Eat lower-sodium products, often labeled as "lower sodium" or "no salt added."  Eat fresh foods.  Eat more vegetables, fruits, and low-fat dairy products.  Choose whole grains. Look for the word "whole" as the first word in the ingredient list.  Choose fish   Limit sweets, desserts, sugars, and sugary drinks.  Choose heart-healthy fats.  Eat veggie cheese   Eat more home-cooked food and less restaurant, buffet, and fast food.  Limit fried foods.  Cook foods using methods other than frying.  Limit canned vegetables. If you do use them, rinse them well to decrease the sodium.  When eating at a restaurant, ask that your food be prepared with less salt, or no salt if possible.                      WHAT FOODS CAN I EAT? Read Dr Fara Olden Fuhrman's books on The End of Dieting & The End of Diabetes  Grains Whole grain or whole wheat bread. Brown rice. Whole grain or whole wheat pasta. Quinoa, bulgur,  and whole grain cereals. Low-sodium cereals. Corn or whole wheat flour tortillas. Whole grain cornbread. Whole grain crackers. Low-sodium crackers.  Vegetables Fresh or frozen vegetables (raw, steamed, roasted, or grilled). Low-sodium or reduced-sodium tomato and vegetable juices. Low-sodium or reduced-sodium tomato sauce and paste. Low-sodium or reduced-sodium canned vegetables.   Fruits All fresh, canned (in natural juice), or frozen fruits.  Protein Products  All fish and seafood.  Dried beans, peas, or lentils. Unsalted nuts and seeds. Unsalted canned beans.  Dairy Low-fat dairy products, such as skim or 1% milk, 2% or reduced-fat cheeses, low-fat ricotta or cottage cheese, or plain low-fat yogurt. Low-sodium or reduced-sodium cheeses.  Fats and Oils Tub margarines without trans fats. Light or reduced-fat mayonnaise and salad dressings (reduced sodium). Avocado. Safflower, olive, or canola oils. Natural peanut or almond butter.  Other Unsalted popcorn and pretzels. The items listed above may not be a complete list of recommended foods or beverages. Contact your dietitian for more options.  +++++++++++++++++++  WHAT FOODS ARE NOT RECOMMENDED? Grains/ White flour or wheat flour White bread. White pasta. White rice. Refined cornbread. Bagels and croissants. Crackers that contain trans fat.  Vegetables  Creamed or fried vegetables. Vegetables in a . Regular canned vegetables.  Regular canned tomato sauce and paste. Regular tomato and vegetable juices.  Fruits Dried fruits. Canned fruit in light or heavy syrup. Fruit juice.  Meat and Other Protein Products Meat in general - RED meat & White meat.  Fatty cuts of meat. Ribs, chicken wings, all processed meats as bacon, sausage, bologna, salami, fatback, hot dogs, bratwurst and packaged luncheon meats.  Dairy Whole or 2% milk, cream, half-and-half, and cream cheese. Whole-fat or sweetened yogurt. Full-fat cheeses or blue cheese.  Non-dairy creamers and whipped toppings. Processed cheese, cheese spreads, or cheese curds.  Condiments Onion and garlic salt, seasoned salt, table salt, and sea salt. Canned and packaged gravies. Worcestershire sauce. Tartar sauce. Barbecue sauce. Teriyaki sauce. Soy sauce, including reduced sodium. Steak sauce. Fish sauce. Oyster sauce. Cocktail sauce. Horseradish. Ketchup and mustard. Meat flavorings and tenderizers. Bouillon cubes. Hot sauce. Tabasco sauce. Marinades. Taco seasonings. Relishes.  Fats and Oils Butter, stick margarine, lard, shortening and bacon fat. Coconut, palm kernel, or palm oils. Regular salad dressings.  Pickles and olives. Salted popcorn and pretzels.  The items listed above may not be a complete list of foods and beverages to avoid.

## 2019-07-23 NOTE — Progress Notes (Signed)
Annual  Screening/Preventative Visit  & Comprehensive Evaluation & Examination     This very nice 33 y.o. MWM presents for a Screening /Preventative Visit & comprehensive evaluation and management of multiple medical co-morbidities.  Patient has been followed expectantly for elevated BP, Cholesterol, abnormal glucose and Vitamin D Deficiency. Patient has hx/o Gout controlled on his Allopurinol.     Labile  HTN predates 2009 (BP 140/94) and 2011 (BP 140/76) and patient is monitored expectantly. 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  Today's BP is at goal - 134/84.  Patient does have hx/o non-cardiac CP for which he takes Gabapentin. Patient denies any cardiac suspect exertional chest pain or  palpitations, shortness of breath, dizziness or ankle swelling.     Patient's hyperlipidemia is not controlled with diet.  Last lipids were not at goal:  Lab Results  Component Value Date   CHOL 228 (H) 07/23/2019   HDL 67 07/23/2019   LDLCALC 136 (H) 07/23/2019   TRIG 129 07/23/2019   CHOLHDL 3.4 07/23/2019      Patient is monitored proactively for glucose intolerance & prediabetes  and patient denies reactive hypoglycemic symptoms, visual blurring, diabetic polys or paresthesias. Current Last A1c is Normal & at goal:  Lab Results  Component Value Date   HGBA1C 5.5 07/23/2019       Finally, patient has history of Vitamin D Deficiency ("37" / 2011 & "17" / 2015)  and Current  vitamin D is very low :  Lab Results  Component Value Date   VD25OH 13 (L) 07/23/2019   Current Outpatient Medications on File Prior to Visit  Medication Sig  . allopurinol (ZYLOPRIM) 300 MG tablet Take 1 tablet Daily to Prevent Gout  . gabapentin (NEURONTIN) 600 MG tablet TAKE 1/2 TO 1 TABLET 2 TO 3 X / DAY AS NEEDED FOR PAIN  . valACYclovir (VALTREX) 1000 MG tablet Take 1/2 to 1 tablet Daily  prophylaxis for Fever blisters /  cold sores   No current facility-administered medications on file prior to visit.    No Known Allergies   Past Medical History:  Diagnosis Date  . Hernia, umbilical    Health Maintenance  Topic Date Due  . INFLUENZA VACCINE  04/04/2019  . TETANUS/TDAP  12/17/2027  . HIV Screening  Completed   Immunization History  Administered Date(s) Administered  . PPD Test 10/15/2013, 02/01/2016, 12/16/2017, 07/23/2019  . Tdap 12/16/2017   Last Colon -  History reviewed. No pertinent surgical history. Family History  Problem Relation Age of Onset  . Diabetes Maternal Aunt   . Diabetes Maternal Uncle   . Cancer Maternal Grandfather    Social History   Socioeconomic History  . Marital status: Single    Spouse name: Not on file  Occupational History  . 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 Smoking status: Current Every Day Smoker    Types: Cigars  . Smokeless tobacco: Never Used  Substance and Sexual Activity  . Alcohol use: Yes    Alcohol/week: 5.0 standard drinks    Types: 5 Cans of beer per week  . Drug use: Yes    Types: Marijuana  . Sexual activity: Yes    ROS Constitutional: Denies fever, chills, weight loss/gain, headaches, insomnia,  night sweats or change in appetite. Does c/o fatigue. Eyes: Denies redness, blurred vision, diplopia, discharge, itchy or watery eyes.  ENT: Denies discharge, congestion,  post nasal drip, epistaxis, sore throat, earache, hearing loss, dental pain, Tinnitus, Vertigo, Sinus pain or snoring.  Cardio: Denies chest pain, palpitations, irregular heartbeat, syncope, dyspnea, diaphoresis, orthopnea, PND, claudication or edema Respiratory: denies cough, dyspnea, DOE, pleurisy, hoarseness, laryngitis or wheezing.  Gastrointestinal: Denies dysphagia, heartburn, reflux, water brash, pain, cramps, nausea, vomiting, bloating, diarrhea, constipation, hematemesis, melena, hematochezia, jaundice or hemorrhoids Genitourinary: Denies  dysuria, frequency, urgency, nocturia, hesitancy, discharge, hematuria or flank pain Musculoskeletal: Denies arthralgia, myalgia, stiffness, Jt. Swelling, pain, limp or strain/sprain. Denies Falls. Skin: Denies puritis, rash, hives, warts, acne, eczema or change in skin lesion Neuro: No weakness, tremor, incoordination, spasms, paresthesia or pain Psychiatric: Denies confusion, memory loss or sensory loss. Denies Depression. Endocrine: Denies change in weight, skin, hair change, nocturia, and paresthesia, diabetic polys, visual blurring or hyper / hypo glycemic episodes.  Heme/Lymph: No excessive bleeding, bruising or enlarged lymph nodes.  Physical Exam  BP 134/84   Pulse 64   Temp 97.9 F (36.6 C)   Resp 16   Ht 6\' 1"  (1.854 m)   Wt 239 lb 9.6 oz (108.7 kg)   BMI 31.61 kg/m   General Appearance: Well nourished and well groomed and in no apparent distress.  Eyes: PERRLA, EOMs, conjunctiva no swelling or erythema, normal fundi and vessels. Sinuses: No frontal/maxillary tenderness ENT/Mouth: EACs patent / TMs  nl. Nares clear without erythema, swelling, mucoid exudates. Oral hygiene is good. No erythema, swelling, or exudate. Tongue normal, non-obstructing. Tonsils not swollen or erythematous. Hearing normal.  Neck: Supple, thyroid not palpable. No bruits, nodes or JVD. Respiratory: Respiratory effort normal.  BS equal and clear bilateral without rales, rhonci, wheezing or stridor. Cardio: Heart sounds are normal with regular rate and rhythm and no murmurs, rubs or gallops. Peripheral pulses are normal and equal bilaterally without edema. No aortic or femoral bruits. Chest: symmetric with normal excursions and percussion.  Abdomen: Soft, with Nl bowel sounds. Nontender, no guarding, rebound, hernias, masses, or organomegaly.  Lymphatics: Non tender without lymphadenopathy.  Musculoskeletal: Full ROM all peripheral extremities, joint stability, 5/5 strength, and normal gait. Skin: Warm  and dry without rashes, lesions, cyanosis, clubbing or  ecchymosis.  Neuro: Cranial nerves intact, reflexes equal bilaterally. Normal muscle tone, no cerebellar symptoms. Sensation intact.  Pysch: Alert and oriented X 3 with normal affect, insight and judgment appropriate.   Assessment and Plan  1. Annual Preventative/Screening Exam   2. Labile hypertension  - EKG 12-Lead - Urinalysis, Routine w reflex microscopic - Microalbumin / Creatinine Urine Ratio - CBC with Diff - COMPLETE METABOLIC PANEL WITH GFR - Magnesium - TSH  3. Hyperlipidemia, mixed  - EKG 12-Lead - Lipid Profile - TSH  4. Abnormal glucose  - EKG 12-Lead - Hemoglobin A1c ([facility]) - Insulin, random  5. Vitamin D deficiency  - Vitamin D (25 hydroxy)  6. Idiopathic gout - Uric acid - predniSONE  20 MG tablet; 1 tab 3 x day for 3 days, then 1 tab 2 x day for 3 days, then 1 tab 1 x day for 5 days  Dispense: 20 tablet; Refill: 1  7. Gastroesophageal reflux disease  - CBC with Diff - famotidine (PEPCID) 40 MG tablet; Take 1 tablet daily   Disp: 90 tablet; Refill: 3  8. Screening for colorectal cancer - POC Hemoccult Bld/Stl  9. Screening examination for pulmonary tuberculosis  - TB Skin Test  10. Fatigue, unspecified type  - Iron,Total/Total Iron Binding Cap - Vitamin B12 - Testosterone, Total - CBC with Diff -  TSH  11. Medication management  - Urinalysis, Routine w reflex microscopic - Microalbumin / Creatinine Urine Ratio - CBC with Diff - COMPLETE METABOLIC PANEL WITH GFR - Magnesium - Lipid Profile - TSH - Hemoglobin A1c ([facility]) - Insulin, random - Vitamin D (25 hydroxy)  12. Screening for ischemic heart disease  - EKG 12-Lead       Patient was counseled in prudent diet, weight control to achieve/maintain BMI less than 25, BP monitoring, regular exercise and medications as discussed.  Discussed med effects and SE's. Routine screening labs and tests as requested with regular  follow-up as recommended. Over 40 minutes of exam, counseling, chart review and high complex critical decision making was performed   Kirtland Bouchard, MD

## 2019-07-24 LAB — COMPLETE METABOLIC PANEL WITH GFR
AG Ratio: 1.5 (calc) (ref 1.0–2.5)
ALT: 219 U/L — ABNORMAL HIGH (ref 9–46)
AST: 103 U/L — ABNORMAL HIGH (ref 10–40)
Albumin: 4.6 g/dL (ref 3.6–5.1)
Alkaline phosphatase (APISO): 90 U/L (ref 36–130)
BUN: 9 mg/dL (ref 7–25)
CO2: 25 mmol/L (ref 20–32)
Calcium: 9.9 mg/dL (ref 8.6–10.3)
Chloride: 100 mmol/L (ref 98–110)
Creat: 0.92 mg/dL (ref 0.60–1.35)
GFR, Est African American: 125 mL/min/{1.73_m2} (ref >=60)
GFR, Est Non African American: 108 mL/min/{1.73_m2} (ref >=60)
Globulin: 3.1 g/dL (calc) (ref 1.9–3.7)
Glucose, Bld: 108 mg/dL — ABNORMAL HIGH (ref 65–99)
Potassium: 3.8 mmol/L (ref 3.5–5.3)
Sodium: 138 mmol/L (ref 135–146)
Total Bilirubin: 1 mg/dL (ref 0.2–1.2)
Total Protein: 7.7 g/dL (ref 6.1–8.1)

## 2019-07-24 LAB — INSULIN, RANDOM: Insulin: 10.8 u[IU]/mL

## 2019-07-24 LAB — CBC WITH DIFFERENTIAL/PLATELET
Absolute Monocytes: 711 cells/uL (ref 200–950)
Basophils Absolute: 90 cells/uL (ref 0–200)
Basophils Relative: 1 %
Eosinophils Absolute: 189 cells/uL (ref 15–500)
Eosinophils Relative: 2.1 %
HCT: 51.7 % — ABNORMAL HIGH (ref 38.5–50.0)
Hemoglobin: 17.6 g/dL — ABNORMAL HIGH (ref 13.2–17.1)
Lymphs Abs: 1683 cells/uL (ref 850–3900)
MCH: 31.9 pg (ref 27.0–33.0)
MCHC: 34 g/dL (ref 32.0–36.0)
MCV: 93.7 fL (ref 80.0–100.0)
MPV: 11.7 fL (ref 7.5–12.5)
Monocytes Relative: 7.9 %
Neutro Abs: 6327 cells/uL (ref 1500–7800)
Neutrophils Relative %: 70.3 %
Platelets: 233 10*3/uL (ref 140–400)
RBC: 5.52 10*6/uL (ref 4.20–5.80)
RDW: 12.5 % (ref 11.0–15.0)
Total Lymphocyte: 18.7 %
WBC: 9 10*3/uL (ref 3.8–10.8)

## 2019-07-24 LAB — URINALYSIS, ROUTINE W REFLEX MICROSCOPIC
Bilirubin Urine: NEGATIVE
Glucose, UA: NEGATIVE
Hgb urine dipstick: NEGATIVE
Leukocytes,Ua: NEGATIVE
Nitrite: NEGATIVE
Protein, ur: NEGATIVE
Specific Gravity, Urine: 1.024 (ref 1.001–1.03)
pH: 6.5 (ref 5.0–8.0)

## 2019-07-24 LAB — HEMOGLOBIN A1C
Hgb A1c MFr Bld: 5.5 % of total Hgb (ref <5.7)
Mean Plasma Glucose: 111 (calc)
eAG (mmol/L): 6.2 (calc)

## 2019-07-24 LAB — LIPID PANEL
Cholesterol: 228 mg/dL — ABNORMAL HIGH (ref <200)
HDL: 67 mg/dL (ref >=40)
LDL Cholesterol (Calc): 136 mg/dL (calc) — ABNORMAL HIGH
Non-HDL Cholesterol (Calc): 161 mg/dL (calc) — ABNORMAL HIGH (ref <130)
Total CHOL/HDL Ratio: 3.4 (calc) (ref <5.0)
Triglycerides: 129 mg/dL (ref <150)

## 2019-07-24 LAB — MICROALBUMIN / CREATININE URINE RATIO
Creatinine, Urine: 321 mg/dL — ABNORMAL HIGH (ref 20–320)
Microalb Creat Ratio: 3 mcg/mg creat (ref <30)
Microalb, Ur: 1.1 mg/dL

## 2019-07-24 LAB — IRON, TOTAL/TOTAL IRON BINDING CAP
%SAT: 90 % (calc) — ABNORMAL HIGH (ref 20–48)
Iron: 319 ug/dL — ABNORMAL HIGH (ref 50–180)
TIBC: 353 mcg/dL (calc) (ref 250–425)

## 2019-07-24 LAB — VITAMIN B12: Vitamin B-12: 493 pg/mL (ref 200–1100)

## 2019-07-24 LAB — MAGNESIUM: Magnesium: 1.9 mg/dL (ref 1.5–2.5)

## 2019-07-24 LAB — TESTOSTERONE: Testosterone: 544 ng/dL (ref 250–827)

## 2019-07-24 LAB — URIC ACID: Uric Acid, Serum: 7.2 mg/dL (ref 4.0–8.0)

## 2019-07-24 LAB — VITAMIN D 25 HYDROXY (VIT D DEFICIENCY, FRACTURES): Vit D, 25-Hydroxy: 13 ng/mL — ABNORMAL LOW (ref 30–100)

## 2019-07-24 LAB — TSH: TSH: 1.98 mIU/L (ref 0.40–4.50)

## 2019-07-27 LAB — TB SKIN TEST
Induration: 0 mm
TB Skin Test: NEGATIVE

## 2019-10-14 ENCOUNTER — Other Ambulatory Visit: Payer: Self-pay | Admitting: Internal Medicine

## 2019-10-14 DIAGNOSIS — R079 Chest pain, unspecified: Secondary | ICD-10-CM

## 2019-10-14 DIAGNOSIS — M940 Chondrocostal junction syndrome [Tietze]: Secondary | ICD-10-CM

## 2020-03-04 ENCOUNTER — Ambulatory Visit: Payer: BC Managed Care – PPO | Admitting: Internal Medicine

## 2020-03-23 ENCOUNTER — Encounter: Payer: BC Managed Care – PPO | Admitting: Internal Medicine

## 2020-03-25 ENCOUNTER — Encounter: Payer: Self-pay | Admitting: Adult Health

## 2020-03-25 NOTE — Progress Notes (Signed)
Assessment and Plan:  Ashir was seen today for back pain.  Diagnoses and all orders for this visit:  Acute bilateral low back pain without sciatica - negative straight leg Suggestive of muscle spasm; Discussed xray but will defer for now Prednisone was prescribed,NSAIDs, RICE, and exercise given If not better follow up in office or will refer to PT/orthopedics. Natural history and expected course discussed. Questions answered. Neurosurgeon distributed. Proper lifting, bending technique discussed. Stretching exercises discussed. Regular aerobic and trunk strengthening exercises discussed. Heat to affected area as needed for local pain relief. Muscle relaxants per medication orders. -     cyclobenzaprine (FLEXERIL) 5 MG tablet; Take 1 tablet (5 mg total) by mouth 3 (three) times daily as needed for muscle spasms. -     predniSONE (DELTASONE) 20 MG tablet; 3 tablets daily with food for 3 days, 2 tabs daily for 3 days, 1 tab a day for 5 days.  Dark urine -     Urinalysis w microscopic + reflex cultur  Labile hypertension Historically labile but fair control; today is atypical Start monitoring blood pressure at home; call if consistently over 130/80; keep log  Discussed DASH diet Advised to go to the ER if any CP, SOB, nausea, dizziness, severe HA, changes vision/speech, left arm numbness and tingling and jaw pain. Follow up in 2 weeks with log to review and for recheck   Further disposition pending results of labs. Discussed med's effects and SE's.   Over 15 minutes of exam, counseling, chart review, and critical decision making was performed.   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 He struggled to sleep that night, went to [facility] the next AM, they didn't have xray, but got steroid and tramadol shot which helped significantly, but then started feeling R lower back tightness, has had alternating lumbar back. Uncomfortable to sleep on his side or supine, has been sleeping on his stomach. He has been able to work through this, worst when he first wakes up in the morning, difficult to bend over, then losens up as the day goes on. Worse after extended periods of rest. 5-6/10. Denies radicular pain, denies extremity weakness, numbness/tingling.   Has tried ibuprofen 200 mg 3-4 per day with some benefit, but hasn't been taking regularly.  Has tried some heat which does help.   He is concerned about whether he might have a kidney stone - reports urine has been a bit dark. Denies hematuria.   Today their BP is BP: (!) 140/110 Labile but fair control on review, similar by provider manual recheck He denies chest pain, shortness of breath, dizziness, HA, vision changes    Past Medical History:  Diagnosis Date  . Hernia, umbilical   . WPW syndrome 10/03/2016  No Known Allergies  Current Outpatient Medications on File Prior to Visit  Medication Sig  . allopurinol (ZYLOPRIM) 300 MG tablet Take 1 tablet Daily to Prevent Gout  . famotidine (PEPCID) 40 MG tablet Take 1 tablet daily for Heartburn & Acid Indigestion  . gabapentin (NEURONTIN) 600 MG tablet TAKE 1/2 TO 1 TABLET 2 TO 3 X / DAY AS NEEDED FOR PAIN  . valACYclovir (VALTREX) 1000 MG tablet Take 1/2 to 1 tablet  Daily  prophylaxis for Fever blisters / cold sores  . predniSONE (DELTASONE) 20 MG tablet 1 tab 3 x day for 3 days, then 1 tab 2 x day for 3 days, then 1 tab 1 x day for 5 days   No current facility-administered medications on file prior to visit.    ROS: all negative except above.   Physical Exam:  BP (!) 140/110   Pulse 92   Temp (!) 96.4 F (35.8 C)   Ht 6' (1.829 m)   Wt (!) 236 lb (107 kg)   SpO2 98%   BMI 32.01 kg/m   General Appearance: Well nourished, in no apparent distress. Eyes: conjunctiva no swelling or erythema ENT/Mouth: Mask in place; Hearing normal.  Neck: Supple Respiratory: Respiratory effort normal, BS equal bilaterally without rales, rhonchi, wheezing or stridor.  Cardio: RRR with no MRGs. Brisk peripheral pulses without edema.  Abdomen: Soft, + BS.  Non tender, no guarding, rebound, hernias, masses. Lymphatics: Non tender without lymphadenopathy.  Musculoskeletal: Patient is able to ambulate well. Gait is not  Antalgic. Straight leg raising with dorsiflexion negative bilaterally for radicular symptoms. Sensory exam in the legs are normal. Knee reflexes are normal Ankle reflexes are normal Strength is normal and symmetric in arms and legs. There is not SI tenderness to palpation.  There is paraspinal muscle spasm.  There is not midline tenderness.  ROM of spine with  limited in all spheres due to pain.  Skin: Warm, dry without rashes, lesions, ecchymosis.  Neuro: Normal muscle tone, Sensation intact.  Psych: Awake and oriented X 3, normal affect, Insight and Judgment appropriate.     Izora Ribas, NP 10:18 AM [facility]

## 2020-03-28 ENCOUNTER — Ambulatory Visit (INDEPENDENT_AMBULATORY_CARE_PROVIDER_SITE_OTHER): Payer: BC Managed Care – PPO | Admitting: Adult Health

## 2020-03-28 ENCOUNTER — Encounter: Payer: Self-pay | Admitting: Adult Health

## 2020-03-28 ENCOUNTER — Other Ambulatory Visit: Payer: Self-pay

## 2020-03-28 VITALS — BP 140/110 | HR 92 | Temp 96.4°F | Ht 72.0 in | Wt 236.0 lb

## 2020-03-28 DIAGNOSIS — R82998 Other abnormal findings in urine: Secondary | ICD-10-CM | POA: Diagnosis not present

## 2020-03-28 DIAGNOSIS — M545 Low back pain, unspecified: Secondary | ICD-10-CM

## 2020-03-28 DIAGNOSIS — R0989 Other specified symptoms and signs involving the circulatory and respiratory systems: Secondary | ICD-10-CM | POA: Diagnosis not present

## 2020-03-28 MED ORDER — CYCLOBENZAPRINE HCL 5 MG PO TABS: 5.0000 mg | ORAL_TABLET | Freq: Three times a day (TID) | ORAL | 0 refills | Status: DC | PRN

## 2020-03-28 MED ORDER — PREDNISONE 20 MG PO TABS: ORAL_TABLET | ORAL | 0 refills | Status: AC

## 2020-03-28 NOTE — Patient Instructions (Addendum)
Goals    . Blood Pressure < 130/80        HYPERTENSION INFORMATION  Monitor your blood pressure at home, please keep a record and bring that in with you to your next office visit.   Go to the ER if any CP, SOB, nausea, dizziness, severe HA, changes vision/speech  Testing/Procedures: HOW TO TAKE YOUR BLOOD PRESSURE:  Rest 5 minutes before taking your blood pressure.  Don't smoke or drink caffeinated beverages for at least 30 minutes before.  Take your blood pressure before (not after) you eat.  Sit comfortably with your back supported and both feet on the floor (don't cross your legs).  Elevate your arm to heart level on a table or a desk.  Use the proper sized cuff. It should fit smoothly and snugly around your bare upper arm. There should be enough room to slip a fingertip under the cuff. The bottom edge of the cuff should be 1 inch above the crease of the elbow.  Your most recent BP: BP: (!) 140/110   Take your medications faithfully as instructed. Maintain a healthy weight. Get at least 150 minutes of aerobic exercise per week. Minimize salt intake. Minimize alcohol intake  DASH Eating Plan DASH stands for "Dietary Approaches to Stop Hypertension." The DASH eating plan is a healthy eating plan that has been shown to reduce high blood pressure (hypertension). Additional health benefits may include reducing the risk of type 2 diabetes mellitus, heart disease, and stroke. The DASH eating plan may also help with weight loss. WHAT DO I NEED TO KNOW ABOUT THE DASH EATING PLAN? For the DASH eating plan, you will follow these general guidelines:  Choose foods with a percent daily value for sodium of less than 5% (as listed on the food label).  Use salt-free seasonings or herbs instead of table salt or sea salt.  Check with your health care provider or pharmacist before using salt substitutes.  Eat lower-sodium products, often labeled as "lower sodium" or "no salt  added."  Eat fresh foods.  Eat more vegetables, fruits, and low-fat dairy products.  Choose whole grains. Look for the word "whole" as the first word in the ingredient list.  Choose fish and skinless chicken or [place] more often than red meat. Limit fish, poultry, and meat to 6 oz (170 g) each day.  Limit sweets, desserts, sugars, and sugary drinks.  Choose heart-healthy fats.  Limit cheese to 1 oz (28 g) per day.  Eat more home-cooked food and less restaurant, buffet, and fast food.  Limit fried foods.  Cook foods using methods other than frying.  Limit canned vegetables. If you do use them, rinse them well to decrease the sodium.  When eating at a restaurant, ask that your food be prepared with less salt, or no salt if possible. WHAT FOODS CAN I EAT? Seek help from a dietitian for individual calorie needs. Grains Whole grain or whole wheat bread. Brown rice. Whole grain or whole wheat pasta. Quinoa, bulgur, and whole grain cereals. Low-sodium cereals. Corn or whole wheat flour tortillas. Whole grain cornbread. Whole grain crackers. Low-sodium crackers. Vegetables Fresh or frozen vegetables (raw, steamed, roasted, or grilled). Low-sodium or reduced-sodium tomato and vegetable juices. Low-sodium or reduced-sodium tomato sauce and paste. Low-sodium or reduced-sodium canned vegetables.  Fruits All fresh, canned (in natural juice), or frozen fruits. Meat and Other Protein Products Ground beef (85% or leaner), grass-fed beef, or beef trimmed of fat. Skinless chicken or [place]. Ground chicken or [place].  Pork trimmed of fat. All fish and seafood. Eggs. Dried beans, peas, or lentils. Unsalted nuts and seeds. Unsalted canned beans. Dairy Low-fat dairy products, such as skim or 1% milk, 2% or reduced-fat cheeses, low-fat ricotta or cottage cheese, or plain low-fat yogurt. Low-sodium or reduced-sodium cheeses. Fats and Oils Tub margarines without trans fats. Light or reduced-fat  mayonnaise and salad dressings (reduced sodium). Avocado. Safflower, olive, or canola oils. Natural peanut or almond butter. Other Unsalted popcorn and pretzels. The items listed above may not be a complete list of recommended foods or beverages. Contact your dietitian for more options. WHAT FOODS ARE NOT RECOMMENDED? Grains White bread. White pasta. White rice. Refined cornbread. Bagels and croissants. Crackers that contain trans fat. Vegetables Creamed or fried vegetables. Vegetables in a cheese sauce. Regular canned vegetables. Regular canned tomato sauce and paste. Regular tomato and vegetable juices. Fruits Dried fruits. Canned fruit in light or heavy syrup. Fruit juice. Meat and Other Protein Products Fatty cuts of meat. Ribs, chicken wings, bacon, sausage, bologna, salami, chitterlings, fatback, hot dogs, bratwurst, and packaged luncheon meats. Salted nuts and seeds. Canned beans with salt. Dairy Whole or 2% milk, cream, half-and-half, and cream cheese. Whole-fat or sweetened yogurt. Full-fat cheeses or blue cheese. Nondairy creamers and whipped toppings. Processed cheese, cheese spreads, or cheese curds. Condiments Onion and garlic salt, seasoned salt, table salt, and sea salt. Canned and packaged gravies. Worcestershire sauce. Tartar sauce. Barbecue sauce. Teriyaki sauce. Soy sauce, including reduced sodium. Steak sauce. Fish sauce. Oyster sauce. Cocktail sauce. Horseradish. Ketchup and mustard. Meat flavorings and tenderizers. Bouillon cubes. Hot sauce. Tabasco sauce. Marinades. Taco seasonings. Relishes. Fats and Oils Butter, stick margarine, lard, shortening, ghee, and bacon fat. Coconut, palm kernel, or palm oils. Regular salad dressings. Other Pickles and olives. Salted popcorn and pretzels. The items listed above may not be a complete list of foods and beverages to avoid. Contact your dietitian for more information. WHERE CAN I FIND MORE INFORMATION? National Heart, Lung, and  Blood Institute: travelstabloid.com Document Released: 08/09/2011 Document Revised: 01/04/2014 Document Reviewed: 06/24/2013 [facility] Patient Information 2015 [place], [place]. This information is not intended to replace advice given to you by your health care provider. Make sure you discuss any questions you have with your health care provider.       Back Exercises These exercises help to make your trunk and back strong. They also help to keep the lower back flexible. Doing these exercises can help to prevent back pain or lessen existing pain.  If you have back pain, try to do these exercises 2-3 times each day or as told by your doctor.  As you get better, do the exercises once each day. Repeat the exercises more often as told by your doctor.  To stop back pain from coming back, do the exercises once each day, or as told by your doctor. Exercises Single knee to chest Do these steps 3-5 times in a row for each leg: 1. Lie on your back on a firm bed or the floor with your legs stretched out. 2. Bring one knee to your chest. 3. Grab your knee or thigh with both hands and hold them it in place. 4. Pull on your knee until you feel a gentle stretch in your lower back or buttocks. 5. Keep doing the stretch for 10-30 seconds. 6. Slowly let go of your leg and straighten it. Pelvic tilt Do these steps 5-10 times in a row: 1. Lie on your back on a firm bed  or the floor with your legs stretched out. 2. Bend your knees so they point up to the ceiling. Your feet should be flat on the floor. 3. Tighten your lower belly (abdomen) muscles to press your lower back against the floor. This will make your tailbone point up to the ceiling instead of pointing down to your feet or the floor. 4. Stay in this position for 5-10 seconds while you gently tighten your muscles and breathe evenly. Cat-cow Do these steps until your lower back bends more easily: 1. Get on your hands  and knees on a firm surface. Keep your hands under your shoulders, and keep your knees under your hips. You may put padding under your knees. 2. Let your head hang down toward your chest. Tighten (contract) the muscles in your belly. Point your tailbone toward the floor so your lower back becomes rounded like the back of a cat. 3. Stay in this position for 5 seconds. 4. Slowly lift your head. Let the muscles of your belly relax. Point your tailbone up toward the ceiling so your back forms a sagging arch like the back of a cow. 5. Stay in this position for 5 seconds.  Press-ups Do these steps 5-10 times in a row: 1. Lie on your belly (face-down) on the floor. 2. Place your hands near your head, about shoulder-width apart. 3. While you keep your back relaxed and keep your hips on the floor, slowly straighten your arms to raise the top half of your body and lift your shoulders. Do not use your back muscles. You may change where you place your hands in order to make yourself more comfortable. 4. Stay in this position for 5 seconds. 5. Slowly return to lying flat on the floor.  Bridges Do these steps 10 times in a row: 1. Lie on your back on a firm surface. 2. Bend your knees so they point up to the ceiling. Your feet should be flat on the floor. Your arms should be flat at your sides, next to your body. 3. Tighten your butt muscles and lift your butt off the floor until your waist is almost as high as your knees. If you do not feel the muscles working in your butt and the back of your thighs, slide your feet 1-2 inches farther away from your butt. 4. Stay in this position for 3-5 seconds. 5. Slowly lower your butt to the floor, and let your butt muscles relax. If this exercise is too easy, try doing it with your arms crossed over your chest. Belly crunches Do these steps 5-10 times in a row: 1. Lie on your back on a firm bed or the floor with your legs stretched out. 2. Bend your knees so they  point up to the ceiling. Your feet should be flat on the floor. 3. Cross your arms over your chest. 4. Tip your chin a little bit toward your chest but do not bend your neck. 5. Tighten your belly muscles and slowly raise your chest just enough to lift your shoulder blades a tiny bit off of the floor. Avoid raising your body higher than that, because it can put too much stress on your low back. 6. Slowly lower your chest and your head to the floor. Back lifts Do these steps 5-10 times in a row: 1. Lie on your belly (face-down) with your arms at your sides, and rest your forehead on the floor. 2. Tighten the muscles in your legs and your butt.  3. Slowly lift your chest off of the floor while you keep your hips on the floor. Keep the back of your head in line with the curve in your back. Look at the floor while you do this. 4. Stay in this position for 3-5 seconds. 5. Slowly lower your chest and your face to the floor. Contact a doctor if:  Your back pain gets a lot worse when you do an exercise.  Your back pain does not get better 2 hours after you exercise. If you have any of these problems, stop doing the exercises. Do not do them again unless your doctor says it is okay. Get help right away if:  You have sudden, very bad back pain. If this happens, stop doing the exercises. Do not do them again unless your doctor says it is okay. This information is not intended to replace advice given to you by your health care provider. Make sure you discuss any questions you have with your health care provider. Document Revised: 05/15/2018 Document Reviewed: 05/15/2018 Elsevier Patient Education  2020 [employer].

## 2020-03-29 LAB — URINALYSIS W MICROSCOPIC + REFLEX CULTURE
Bacteria, UA: NONE SEEN /HPF
Bilirubin Urine: NEGATIVE
Glucose, UA: NEGATIVE
Hyaline Cast: NONE SEEN /LPF
Leukocyte Esterase: NEGATIVE
Nitrites, Initial: NEGATIVE
Protein, ur: NEGATIVE
RBC / HPF: NONE SEEN /HPF (ref 0–2)
Specific Gravity, Urine: 1.016 (ref 1.001–1.03)
Squamous Epithelial / HPF: NONE SEEN /HPF (ref <=5)
WBC, UA: NONE SEEN /HPF (ref 0–5)
pH: 6 (ref 5.0–8.0)

## 2020-03-29 LAB — NO CULTURE INDICATED

## 2020-04-07 ENCOUNTER — Other Ambulatory Visit: Payer: Self-pay | Admitting: Adult Health

## 2020-04-07 ENCOUNTER — Telehealth: Payer: Self-pay | Admitting: Adult Health

## 2020-04-07 DIAGNOSIS — M545 Low back pain, unspecified: Secondary | ICD-10-CM

## 2020-04-07 NOTE — Telephone Encounter (Signed)
Called patient to confirm Monday appt,- patient still in pain, requesting imaging. Please advise patient your recommendation.

## 2020-04-07 NOTE — Progress Notes (Signed)
Assessment and Plan:  Garon was seen today for follow-up.  Diagnoses and all orders for this visit:  Labile hypertension Initiate medication: HCTZ due to primarily diastolic elevation - start 12.5 mg daily, then increase to 25 mg in 1 week if remains above goal  Monitor blood pressure at home; call if consistently over 130/80 Discussed DASH diet Advised to go to the ER if any CP, SOB, nausea, dizziness, severe HA, changes vision/speech, left arm numbness and tingling and jaw pain. Follow up in 4 weeks  -     hydrochlorothiazide (HYDRODIURIL) 25 MG tablet; Take 1 tablet daily for BP and fluid -     COMPLETE METABOLIC PANEL WITH GFR -     Magnesium -     TSH -     Urinalysis, Routine w reflex microscopic  Further disposition pending results of labs. Discussed med's effects and SE's.   Over 15 minutes of exam, counseling, chart review, and critical decision making was performed.   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 Notably he had acute lower back pain at that time, bil intermittent without radicular sx, lumbar xray on 04/08/2020 appeared benign. He does reports back pain has slowly been improving, hasn't needed NSAIDs.   His blood pressure has not been controlled at home (130s/90s-106), today their BP is BP: (!) 130/106 He denies chest pain, shortness of breath, dizziness, HA, edema. Reports rare palpitations, new in the last year, brief, not on any regular basis, last over 1 month ago.    02/06/2017 03/26/2017 12/16/2017  06/16/2018 07/02/2018 01/08/2019 07/23/2019  BP 132/82 142/80 (A) 122/80 126/84 128/88 122/74 134/84    03/28/2020  BP 140/110 (A)   Denies smoking Caffeine - minimal, none on regular basis Denies drug use Water - 2-10 bottles, highly variable Diet - admits very poor, lots of processed   BMI is Body mass index is 32.52 kg/m., he has not been working on diet and exercise. Wt Readings from Last 3 Encounters:  04/11/20 239 lb 12.8 oz (108.8 kg)  03/28/20 (!) 236 lb (107 kg)  07/23/19 239 lb 9.6 oz (108.7 kg)      Past Medical History:  Diagnosis Date  . Hernia, umbilical   . 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 allopurinol (ZYLOPRIM) 300 MG tablet Take 1 tablet Daily to Prevent Gout  . cyclobenzaprine (FLEXERIL) 5 MG tablet Take 1 tablet (5 mg total) by mouth 3 (three) times daily as needed for muscle spasms.  . famotidine (PEPCID) 40 MG tablet Take 1 tablet daily for Heartburn & Acid Indigestion  . gabapentin (NEURONTIN) 600 MG tablet TAKE 1/2 TO 1 TABLET 2 TO 3 X / DAY AS NEEDED FOR PAIN  . valACYclovir (VALTREX) 1000 MG tablet Take 1/2 to 1 tablet Daily  prophylaxis for Fever blisters / cold sores   No current facility-administered medications on file prior to visit.    ROS: all negative except above.   Physical Exam:  BP (!) 130/106   Pulse 97   Temp (!) 97.3  F (36.3 C)   Wt 239 lb 12.8 oz (108.8 kg)   SpO2 97%   BMI 32.52 kg/m   General Appearance: Well nourished, in no apparent distress. Eyes: PERRLA, EOMs, conjunctiva no swelling or erythema Sinuses: No Frontal/maxillary tenderness ENT/Mouth: Ext aud canals clear, TMs without erythema, bulging. No erythema, swelling, or exudate on post pharynx.  Tonsils not swollen or erythematous. Hearing normal.  Neck: Supple, thyroid normal.  Respiratory: Respiratory effort normal, BS equal bilaterally without rales, rhonchi, wheezing or  stridor.  Cardio: RRR with no MRGs. Brisk peripheral pulses without edema.  Abdomen: Soft, + BS.  Non tender, no guarding, rebound, hernias, masses. Lymphatics: Non tender without lymphadenopathy.  Musculoskeletal: Full ROM, 5/5 strength, normal gait.  Skin: Warm, dry without rashes, lesions, ecchymosis.  Neuro: Cranial nerves intact. Normal muscle tone, no cerebellar symptoms. Sensation intact.  Psych: Awake and oriented X 3, normal affect, Insight and Judgment appropriate.     Izora Ribas, NP 8:54 AM [facility]

## 2020-04-08 ENCOUNTER — Ambulatory Visit
Admission: RE | Admit: 2020-04-08 | Discharge: 2020-04-08 | Disposition: A | Discharge status: Home / Self Care | Payer: BC Managed Care – PPO | Source: Ambulatory Visit | Attending: Adult Health | Admitting: Adult Health

## 2020-04-08 ENCOUNTER — Other Ambulatory Visit: Payer: Self-pay

## 2020-04-08 DIAGNOSIS — M545 Low back pain, unspecified: Secondary | ICD-10-CM

## 2020-04-11 ENCOUNTER — Other Ambulatory Visit: Payer: Self-pay

## 2020-04-11 ENCOUNTER — Encounter: Payer: Self-pay | Admitting: Adult Health

## 2020-04-11 ENCOUNTER — Ambulatory Visit (INDEPENDENT_AMBULATORY_CARE_PROVIDER_SITE_OTHER): Payer: BC Managed Care – PPO | Admitting: Adult Health

## 2020-04-11 VITALS — BP 130/106 | HR 97 | Temp 97.3°F | Wt 239.8 lb

## 2020-04-11 DIAGNOSIS — Z79899 Other long term (current) drug therapy: Secondary | ICD-10-CM

## 2020-04-11 DIAGNOSIS — R0989 Other specified symptoms and signs involving the circulatory and respiratory systems: Secondary | ICD-10-CM

## 2020-04-11 MED ORDER — HYDROCHLOROTHIAZIDE 25 MG PO TABS
ORAL_TABLET | ORAL | 1 refills | Status: DC
Start: 2020-04-11 — End: 2020-05-05

## 2020-04-11 NOTE — Patient Instructions (Addendum)
Goals    . Blood Pressure < 130/80    . Weight (lb) < 200 lb (90.7 kg)       Focus on reducing salt intake (processed foods, fast food, anything in a package), making sure water intake is good (80+ fluid ounces daily)  Also lots of research showing excess animal product intake may be harmful  Focus more of fresh fruits and veggies  Magnesium supplement may be helpful for blood pressure - checking levels today    Plan to start 1/2 tab hydrochlorothiazide (HCTZ) daily in the morning; if still above goal in 1 week, increase up to whole tab. Keep a log and follow up in 4-6 weeks.       HYPERTENSION INFORMATION  Monitor your blood pressure at home, please keep a record and bring that in with you to your next office visit.   Go to the ER if any CP, SOB, nausea, dizziness, severe HA, changes vision/speech  Testing/Procedures: HOW TO TAKE YOUR BLOOD PRESSURE:  Rest 5 minutes before taking your blood pressure.  Don't smoke or drink caffeinated beverages for at least 30 minutes before.  Take your blood pressure before (not after) you eat.  Sit comfortably with your back supported and both feet on the floor (don't cross your legs).  Elevate your arm to heart level on a table or a desk.  Use the proper sized cuff. It should fit smoothly and snugly around your bare upper arm. There should be enough room to slip a fingertip under the cuff. The bottom edge of the cuff should be 1 inch above the crease of the elbow.   Your most recent BP: BP: (!) 130/106   Take your medications faithfully as instructed. Maintain a healthy weight. Get at least 150 minutes of aerobic exercise per week. Minimize salt intake. Minimize alcohol intake  DASH Eating Plan DASH stands for "Dietary Approaches to Stop Hypertension." The DASH eating plan is a healthy eating plan that has been shown to reduce high blood pressure (hypertension). Additional health benefits may include reducing the risk of type 2  diabetes mellitus, heart disease, and stroke. The DASH eating plan may also help with weight loss. WHAT DO I NEED TO KNOW ABOUT THE DASH EATING PLAN? For the DASH eating plan, you will follow these general guidelines:  Choose foods with a percent daily value for sodium of less than 5% (as listed on the food label).  Use salt-free seasonings or herbs instead of table salt or sea salt.  Check with your health care provider or pharmacist before using salt substitutes.  Eat lower-sodium products, often labeled as "lower sodium" or "no salt added."  Eat fresh foods.  Eat more vegetables, fruits, and low-fat dairy products.  Choose whole grains. Look for the word "whole" as the first word in the ingredient list.  Choose fish and skinless chicken or [place] more often than red meat. Limit fish, poultry, and meat to 6 oz (170 g) each day.  Limit sweets, desserts, sugars, and sugary drinks.  Choose heart-healthy fats.  Limit cheese to 1 oz (28 g) per day.  Eat more home-cooked food and less restaurant, buffet, and fast food.  Limit fried foods.  Cook foods using methods other than frying.  Limit canned vegetables. If you do use them, rinse them well to decrease the sodium.  When eating at a restaurant, ask that your food be prepared with less salt, or no salt if possible. WHAT FOODS CAN I EAT? Seek help  from a dietitian for individual calorie needs. Grains Whole grain or whole wheat bread. Brown rice. Whole grain or whole wheat pasta. Quinoa, bulgur, and whole grain cereals. Low-sodium cereals. Corn or whole wheat flour tortillas. Whole grain cornbread. Whole grain crackers. Low-sodium crackers. Vegetables Fresh or frozen vegetables (raw, steamed, roasted, or grilled). Low-sodium or reduced-sodium tomato and vegetable juices. Low-sodium or reduced-sodium tomato sauce and paste. Low-sodium or reduced-sodium canned vegetables.  Fruits All fresh, canned (in natural juice), or frozen  fruits. Meat and Other Protein Products Ground beef (85% or leaner), grass-fed beef, or beef trimmed of fat. Skinless chicken or [place]. Ground chicken or [place]. Pork trimmed of fat. All fish and seafood. Eggs. Dried beans, peas, or lentils. Unsalted nuts and seeds. Unsalted canned beans. Dairy Low-fat dairy products, such as skim or 1% milk, 2% or reduced-fat cheeses, low-fat ricotta or cottage cheese, or plain low-fat yogurt. Low-sodium or reduced-sodium cheeses. Fats and Oils Tub margarines without trans fats. Light or reduced-fat mayonnaise and salad dressings (reduced sodium). Avocado. Safflower, olive, or canola oils. Natural peanut or almond butter. Other Unsalted popcorn and pretzels. The items listed above may not be a complete list of recommended foods or beverages. Contact your dietitian for more options. WHAT FOODS ARE NOT RECOMMENDED? Grains White bread. White pasta. White rice. Refined cornbread. Bagels and croissants. Crackers that contain trans fat. Vegetables Creamed or fried vegetables. Vegetables in a cheese sauce. Regular canned vegetables. Regular canned tomato sauce and paste. Regular tomato and vegetable juices. Fruits Dried fruits. Canned fruit in light or heavy syrup. Fruit juice. Meat and Other Protein Products Fatty cuts of meat. Ribs, chicken wings, bacon, sausage, bologna, salami, chitterlings, fatback, hot dogs, bratwurst, and packaged luncheon meats. Salted nuts and seeds. Canned beans with salt. Dairy Whole or 2% milk, cream, half-and-half, and cream cheese. Whole-fat or sweetened yogurt. Full-fat cheeses or blue cheese. Nondairy creamers and whipped toppings. Processed cheese, cheese spreads, or cheese curds. Condiments Onion and garlic salt, seasoned salt, table salt, and sea salt. Canned and packaged gravies. Worcestershire sauce. Tartar sauce. Barbecue sauce. Teriyaki sauce. Soy sauce, including reduced sodium. Steak sauce. Fish sauce. Oyster sauce. Cocktail  sauce. Horseradish. Ketchup and mustard. Meat flavorings and tenderizers. Bouillon cubes. Hot sauce. Tabasco sauce. Marinades. Taco seasonings. Relishes. Fats and Oils Butter, stick margarine, lard, shortening, ghee, and bacon fat. Coconut, palm kernel, or palm oils. Regular salad dressings. Other Pickles and olives. Salted popcorn and pretzels. The items listed above may not be a complete list of foods and beverages to avoid. Contact your dietitian for more information. WHERE CAN I FIND MORE INFORMATION? National Heart, Lung, and Blood Institute: travelstabloid.com Document Released: 08/09/2011 Document Revised: 01/04/2014 Document Reviewed: 06/24/2013 [facility] Patient Information 2015 [place], [place]. This information is not intended to replace advice given to you by your health care provider. Make sure you discuss any questions you have with your health care provider.       Hydrochlorothiazide, HCTZ Oral Capsules or Tablets What is this medicine? HYDROCHLOROTHIAZIDE (hye droe klor oh THYE a zide) is a diuretic. It helps you make more urine and to lose salt and excess water from your body. It treats swelling from heart, kidney, or liver disease. It also treats high blood pressure. This medicine may be used for other purposes; ask your health care provider or pharmacist if you have questions. COMMON BRAND NAME(S): Esidrix, Ezide, HydroDIURIL, Microzide, Oretic, Zide What should I tell my health care provider before I take this medicine? They need to  know if you have any of these conditions:  diabetes  gout  immune system problems, like lupus  kidney disease or kidney stones  liver disease  pancreatitis  small amount of urine or difficulty passing urine  an unusual or allergic reaction to hydrochlorothiazide, sulfa drugs, other medicines, foods, dyes, or preservatives  pregnant or trying to get pregnant  breast-feeding How should I use this  medicine? Take this drug by mouth. Take it as directed on the prescription label at the same time every day. You can take it with or without food. If it upsets your stomach, take it with food. Keep taking it unless your health care provider tells you to stop. Talk to your health care provider about the use of this drug in children. While it may be prescribed for children as young as newborns for selected conditions, precautions do apply. Overdosage: If you think you have taken too much of this medicine contact a poison control center or emergency room at once. NOTE: This medicine is only for you. Do not share this medicine with others. What if I miss a dose? If you miss a dose, take it as soon as you can. If it is almost time for your next dose, take only that dose. Do not take double or extra doses. What may interact with this medicine?  cholestyramine  colestipol  digoxin  dofetilide  lithium  medicines for blood pressure  medicines for diabetes  medicines that relax muscles for surgery  other diuretics  steroid medicines like prednisone or cortisone This list may not describe all possible interactions. Give your health care provider a list of all the medicines, herbs, non-prescription drugs, or dietary supplements you use. Also tell them if you smoke, drink alcohol, or use illegal drugs. Some items may interact with your medicine. What should I watch for while using this medicine? Visit your doctor or health care professional for regular checks on your progress. Check your blood pressure as directed. Ask your doctor or health care professional what your blood pressure should be and when you should contact him or her. Talk to your health care professional about your risk of skin cancer. You may be more at risk for skin cancer if you take this medicine. This medicine can make you more sensitive to the sun. Keep out of the sun. If you cannot avoid being in the sun, wear protective  clothing and use sunscreen. Do not use sun lamps or tanning beds/booths. You may need to be on a special diet while taking this medicine. Ask your doctor. Check with your doctor or health care professional if you get an attack of severe diarrhea, nausea and vomiting, or if you sweat a lot. The loss of too much body fluid can make it dangerous for you to take this medicine. You may get drowsy or dizzy. Do not drive, use machinery, or do anything that needs mental alertness until you know how this medicine affects you. Do not stand or sit up quickly, especially if you are an older patient. This reduces the risk of dizzy or fainting spells. Alcohol may interfere with the effect of this medicine. Avoid alcoholic drinks. This medicine may increase blood sugar. Ask your healthcare provider if changes in diet or medicines are needed if you have diabetes. What side effects may I notice from receiving this medicine? Side effects that you should report to your doctor or health care professional as soon as possible:  allergic reactions such as skin rash or  itching, hives, swelling of the lips, mouth, tongue, or throat  changes in vision  chest pain  eye pain  fast or irregular heartbeat  feeling faint or lightheaded, falls  gout attack  muscle pain or cramps  pain or difficulty when passing urine  pain, tingling, numbness in the hands or feet  redness, blistering, peeling or loosening of the skin, including inside the mouth   signs and symptoms of high blood sugar such as being more thirsty or hungry or having to urinate more than normal. You may also feel very tired or have blurry vision.  unusually weak Side effects that usually do not require medical attention (report to your doctor or health care professional if they continue or are bothersome):  change in sex drive or performance  dry mouth  headache  stomach upset This list may not describe all possible side effects. Call your  doctor for medical advice about side effects. You may report side effects to FDA at 1-800-FDA-1088. Where should I keep my medicine? Keep out of the reach of children and pets. Store at room temperature between 20 and 25 degrees C (68 and 77 degrees F). Protect from light and moisture. Keep the container tightly closed. Do not freeze. Throw away any unused drug after the expiration date. NOTE: This sheet is a summary. It may not cover all possible information. If you have questions about this medicine, talk to your doctor, pharmacist, or health care provider.  2020 Elsevier/Gold Standard (2019-04-23 16:52:59)

## 2020-04-12 ENCOUNTER — Encounter: Payer: Self-pay | Admitting: Adult Health

## 2020-04-12 DIAGNOSIS — R7989 Other specified abnormal findings of blood chemistry: Secondary | ICD-10-CM | POA: Insufficient documentation

## 2020-04-12 LAB — URINALYSIS, ROUTINE W REFLEX MICROSCOPIC
Bilirubin Urine: NEGATIVE
Glucose, UA: NEGATIVE
Hgb urine dipstick: NEGATIVE
Ketones, ur: NEGATIVE
Leukocytes,Ua: NEGATIVE
Nitrite: NEGATIVE
Protein, ur: NEGATIVE
Specific Gravity, Urine: 1.021 (ref 1.001–1.03)
pH: 5.5 (ref 5.0–8.0)

## 2020-04-12 LAB — COMPLETE METABOLIC PANEL WITH GFR
AG Ratio: 1.5 (calc) (ref 1.0–2.5)
ALT: 145 U/L — ABNORMAL HIGH (ref 9–46)
AST: 75 U/L — ABNORMAL HIGH (ref 10–40)
Albumin: 4.3 g/dL (ref 3.6–5.1)
Alkaline phosphatase (APISO): 73 U/L (ref 36–130)
BUN: 8 mg/dL (ref 7–25)
CO2: 29 mmol/L (ref 20–32)
Calcium: 9.6 mg/dL (ref 8.6–10.3)
Chloride: 102 mmol/L (ref 98–110)
Creat: 0.99 mg/dL (ref 0.60–1.35)
GFR, Est African American: 114 mL/min/{1.73_m2} (ref >=60)
GFR, Est Non African American: 98 mL/min/{1.73_m2} (ref >=60)
Globulin: 2.9 g/dL (calc) (ref 1.9–3.7)
Glucose, Bld: 102 mg/dL — ABNORMAL HIGH (ref 65–99)
Potassium: 4.4 mmol/L (ref 3.5–5.3)
Sodium: 138 mmol/L (ref 135–146)
Total Bilirubin: 0.6 mg/dL (ref 0.2–1.2)
Total Protein: 7.2 g/dL (ref 6.1–8.1)

## 2020-04-12 LAB — MAGNESIUM: Magnesium: 1.9 mg/dL (ref 1.5–2.5)

## 2020-04-12 LAB — TSH: TSH: 1.3 mIU/L (ref 0.40–4.50)

## 2020-04-22 ENCOUNTER — Ambulatory Visit: Payer: BC Managed Care – PPO | Admitting: Internal Medicine

## 2020-05-05 ENCOUNTER — Other Ambulatory Visit: Payer: Self-pay | Admitting: Adult Health

## 2020-05-16 NOTE — Progress Notes (Signed)
Assessment and Plan:  Timothy Rivers was seen today for follow-up.  Diagnoses and all orders for this visit:  Labile hypertension Increase to 25 mg HCTZ then if remains above 130/80 start losartan in 1 week  Monitor blood pressure at home; call if consistently over 130/80 Discussed DASH diet Advised to go to the ER if any CP, SOB, nausea, dizziness, severe HA, changes vision/speech, left arm numbness and tingling and jaw pain. Follow up in 4 weeks; also follows with Dr. Lovena Le - if poor response despite dual agents, may indicate u -     COMPLETE METABOLIC PANEL WITH GFR -     Magnesium -     US Abdomen Complete; Future -     DG Chest 2 View; Future -     losartan (COZAAR) 50 MG tablet; Take 1 tablet (50 mg total) by mouth daily.  Elevated LFTs -     COMPLETE METABOLIC PANEL WITH GFR -     Hepatitis Acute Panel -     Iron -     Ferritin -     US Abdomen Complete; Future  Left-sided chest pain Left flank discomfort Poorly localized, normal exam; check CXR, will proceed with [place] If remains poorly explained but persistent/progressive consider CT abd -     DG Chest 2 View; Future -     US Abdomen Complete; Future  Further disposition pending results of labs. Discussed med's effects and SE's.   Over 15 minutes of exam, counseling, chart review, and critical decision making was performed.   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 HCTZ was added at last visit with  instructions to taper to 25 mg.   His blood pressure has not been controlled at home (130-140s/95-100), today their BP is BP: (!) 140/100 He denies chest pain, shortness of breath, dizziness, HA, edema.  Reports rare palpitations, without accompaniments.   BP Readings from Last 3 Encounters:  05/17/20 (!) 140/100  04/11/20 (!) 130/106  03/28/20 (!) 140/110   Denies smoking Caffeine - minimal, none on regular basis Denies drug use Alcohol - 1-2 beers in the evening  Water - 2-10 bottles, highly variable Diet - admits very poor, lots of processed  BMI is Body mass index is 32.28 kg/m., he has not been working on diet and exercise. Wt Readings from Last 3 Encounters:  05/17/20 238 lb (108 kg)  04/11/20 239 lb 12.8 oz (108.8 kg)  03/28/20 (!) 236 lb (107 kg)   He was also found to have elevated LFTs, denied any concerning sx, was given lifestyle recommendations and returns for recheck with hepatitis panel and iron levels.  Lab Results  Component Value Date   ALT 145 (H) 04/11/2020   AST 75 (H) 04/11/2020   ALKPHOS 44 02/01/2016   BILITOT 0.6 04/11/2020   He is also concerned about new 6-8 week persistent left flank/upper abdominal/lower chest pain, persistent, aching with intermittent shooting/irritating pain, denies any GI sx (n/v/d/c, stools changes, blood, melena), urinary changes. Certain positions make it worse, but no midline or paraspinal pain mostly, more lateral.  Nothing seems to help, has tried ibuprofen, tylenol. Had unremarkable lumbar xray 04/08/2020. No inciting event. Sometimes deep breaths makes it hurt, but no dyspnea, wheezing, cough. No sig smoking hx.   Past Medical History:  Diagnosis Date  . Hernia, umbilical   . 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 allopurinol (ZYLOPRIM) 300 MG tablet Take 1 tablet Daily to Prevent Gout  . cyclobenzaprine (FLEXERIL) 5 MG tablet Take 1  tablet (5 mg total) by mouth 3 (three) times daily as needed for muscle spasms.  . famotidine (PEPCID) 40 MG tablet Take 1 tablet daily for Heartburn & Acid Indigestion  . gabapentin (NEURONTIN) 600 MG tablet TAKE 1/2 TO 1 TABLET 2 TO 3 X / DAY AS NEEDED FOR PAIN  . hydrochlorothiazide (HYDRODIURIL) 25 MG tablet TAKE 1 TABLET DAILY FOR BP AND FLUID  . valACYclovir (VALTREX) 1000 MG tablet Take 1/2 to 1 tablet Daily  prophylaxis for Fever blisters / cold sores   No current facility-administered medications on file prior to visit.    ROS: all negative except above.   Physical Exam:  BP (!) 140/100   Pulse 96   Temp 98.6 F (37 C)   Wt 238 lb (108 kg)   SpO2 98%   BMI 32.28 kg/m   General Appearance: Well nourished, in no apparent distress. Eyes: PERRLA, EOMs, conjunctiva no swelling or erythema Sinuses: No Frontal/maxillary tenderness ENT/Mouth: Ext aud canals clear, TMs without erythema, bulging. No erythema, swelling, or exudate on post pharynx.  Tonsils not swollen or erythematous. Hearing normal.  Neck: Supple, thyroid normal.  Respiratory: Respiratory effort normal, BS equal bilaterally without rales, rhonchi, wheezing or stridor.  Cardio: RRR with no MRGs, normal S1/S2 but bounding. Brisk peripheral pulses without edema.  Abdomen: Soft, + BS.  Non tender, no guarding, rebound, hernias, masses. Lymphatics: Non tender without lymphadenopathy.  Musculoskeletal: Full ROM, 5/5 strength, normal gait.  Skin: Warm, dry without rashes, lesions, ecchymosis.  Neuro: Cranial nerves intact. Normal muscle tone, no cerebellar symptoms. Sensation intact.  Psych: Awake and oriented X 3, normal affect, Insight and Judgment appropriate.     Izora Ribas, NP 10:10 AM [facility]

## 2020-05-17 ENCOUNTER — Ambulatory Visit (INDEPENDENT_AMBULATORY_CARE_PROVIDER_SITE_OTHER): Payer: BC Managed Care – PPO | Admitting: Adult Health

## 2020-05-17 ENCOUNTER — Encounter: Payer: Self-pay | Admitting: Adult Health

## 2020-05-17 ENCOUNTER — Other Ambulatory Visit: Payer: Self-pay

## 2020-05-17 ENCOUNTER — Ambulatory Visit
Admission: RE | Admit: 2020-05-17 | Discharge: 2020-05-17 | Disposition: A | Discharge status: Home / Self Care | Payer: BC Managed Care – PPO | Source: Ambulatory Visit | Attending: Adult Health | Admitting: Adult Health

## 2020-05-17 VITALS — BP 140/100 | HR 96 | Temp 98.6°F | Wt 238.0 lb

## 2020-05-17 DIAGNOSIS — R079 Chest pain, unspecified: Secondary | ICD-10-CM

## 2020-05-17 DIAGNOSIS — R109 Unspecified abdominal pain: Secondary | ICD-10-CM

## 2020-05-17 DIAGNOSIS — R0989 Other specified symptoms and signs involving the circulatory and respiratory systems: Secondary | ICD-10-CM

## 2020-05-17 DIAGNOSIS — R7989 Other specified abnormal findings of blood chemistry: Secondary | ICD-10-CM | POA: Diagnosis not present

## 2020-05-17 MED ORDER — LOSARTAN POTASSIUM 50 MG PO TABS
50.0000 mg | ORAL_TABLET | Freq: Every day | ORAL | 0 refills | Status: DC
Start: 2020-05-17 — End: 2020-06-14

## 2020-05-17 NOTE — Patient Instructions (Addendum)
Goals    . Blood Pressure < 130/80    . Weight (lb) < 200 lb (90.7 kg)          Go up to full tab HCTZ, then if above BP goal in 1 week add losartan - if still persistently above goal can increase to 2 tabs (100 mg) daily if you want  Can walk in for chest xray at [address] imaging center    YOU CAN CALL TO MAKE AN ULTRASOUND..  I have put in an order for an ultrasound for you to have You can set them up at your convenience by calling this number [phone] You will likely have the ultrasound at [place] [address]  If you have any issues call our office and we will set this up for you.    HYPERTENSION INFORMATION  Monitor your blood pressure at home, please keep a record and bring that in with you to your next office visit.   Go to the ER if any CP, SOB, nausea, dizziness, severe HA, changes vision/speech  Testing/Procedures: HOW TO TAKE YOUR BLOOD PRESSURE:  Rest 5 minutes before taking your blood pressure.  Don't smoke or drink caffeinated beverages for at least 30 minutes before.  Take your blood pressure before (not after) you eat.  Sit comfortably with your back supported and both feet on the floor (don't cross your legs).  Elevate your arm to heart level on a table or a desk.  Use the proper sized cuff. It should fit smoothly and snugly around your bare upper arm. There should be enough room to slip a fingertip under the cuff. The bottom edge of the cuff should be 1 inch above the crease of the elbow.   Your most recent BP: BP: (!) 140/100   Take your medications faithfully as instructed. Maintain a healthy weight. Get at least 150 minutes of aerobic exercise per week. Minimize salt intake. Minimize alcohol intake  DASH Eating Plan DASH stands for "Dietary Approaches to Stop Hypertension." The DASH eating plan is a healthy eating plan that has been shown to reduce high blood pressure (hypertension). Additional health benefits may  include reducing the risk of type 2 diabetes mellitus, heart disease, and stroke. The DASH eating plan may also help with weight loss. WHAT DO I NEED TO KNOW ABOUT THE DASH EATING PLAN? For the DASH eating plan, you will follow these general guidelines:  Choose foods with a percent daily value for sodium of less than 5% (as listed on the food label).  Use salt-free seasonings or herbs instead of table salt or sea salt.  Check with your health care provider or pharmacist before using salt substitutes.  Eat lower-sodium products, often labeled as "lower sodium" or "no salt added."  Eat fresh foods.  Eat more vegetables, fruits, and low-fat dairy products.  Choose whole grains. Look for the word "whole" as the first word in the ingredient list.  Choose fish and skinless chicken or [place] more often than red meat. Limit fish, poultry, and meat to 6 oz (170 g) each day.  Limit sweets, desserts, sugars, and sugary drinks.  Choose heart-healthy fats.  Limit cheese to 1 oz (28 g) per day.  Eat more home-cooked food and less restaurant, buffet, and fast food.  Limit fried foods.  Cook foods using methods other than frying.  Limit canned vegetables. If you do use them, rinse them well to decrease the sodium.  When eating  at a restaurant, ask that your food be prepared with less salt, or no salt if possible. WHAT FOODS CAN I EAT? Seek help from a dietitian for individual calorie needs. Grains Whole grain or whole wheat bread. Brown rice. Whole grain or whole wheat pasta. Quinoa, bulgur, and whole grain cereals. Low-sodium cereals. Corn or whole wheat flour tortillas. Whole grain cornbread. Whole grain crackers. Low-sodium crackers. Vegetables Fresh or frozen vegetables (raw, steamed, roasted, or grilled). Low-sodium or reduced-sodium tomato and vegetable juices. Low-sodium or reduced-sodium tomato sauce and paste. Low-sodium or reduced-sodium canned vegetables.  Fruits All fresh,  canned (in natural juice), or frozen fruits. Meat and Other Protein Products Ground beef (85% or leaner), grass-fed beef, or beef trimmed of fat. Skinless chicken or [place]. Ground chicken or [place]. Pork trimmed of fat. All fish and seafood. Eggs. Dried beans, peas, or lentils. Unsalted nuts and seeds. Unsalted canned beans. Dairy Low-fat dairy products, such as skim or 1% milk, 2% or reduced-fat cheeses, low-fat ricotta or cottage cheese, or plain low-fat yogurt. Low-sodium or reduced-sodium cheeses. Fats and Oils Tub margarines without trans fats. Light or reduced-fat mayonnaise and salad dressings (reduced sodium). Avocado. Safflower, olive, or canola oils. Natural peanut or almond butter. Other Unsalted popcorn and pretzels. The items listed above may not be a complete list of recommended foods or beverages. Contact your dietitian for more options. WHAT FOODS ARE NOT RECOMMENDED? Grains White bread. White pasta. White rice. Refined cornbread. Bagels and croissants. Crackers that contain trans fat. Vegetables Creamed or fried vegetables. Vegetables in a cheese sauce. Regular canned vegetables. Regular canned tomato sauce and paste. Regular tomato and vegetable juices. Fruits Dried fruits. Canned fruit in light or heavy syrup. Fruit juice. Meat and Other Protein Products Fatty cuts of meat. Ribs, chicken wings, bacon, sausage, bologna, salami, chitterlings, fatback, hot dogs, bratwurst, and packaged luncheon meats. Salted nuts and seeds. Canned beans with salt. Dairy Whole or 2% milk, cream, half-and-half, and cream cheese. Whole-fat or sweetened yogurt. Full-fat cheeses or blue cheese. Nondairy creamers and whipped toppings. Processed cheese, cheese spreads, or cheese curds. Condiments Onion and garlic salt, seasoned salt, table salt, and sea salt. Canned and packaged gravies. Worcestershire sauce. Tartar sauce. Barbecue sauce. Teriyaki sauce. Soy sauce, including reduced sodium. Steak  sauce. Fish sauce. Oyster sauce. Cocktail sauce. Horseradish. Ketchup and mustard. Meat flavorings and tenderizers. Bouillon cubes. Hot sauce. Tabasco sauce. Marinades. Taco seasonings. Relishes. Fats and Oils Butter, stick margarine, lard, shortening, ghee, and bacon fat. Coconut, palm kernel, or palm oils. Regular salad dressings. Other Pickles and olives. Salted popcorn and pretzels. The items listed above may not be a complete list of foods and beverages to avoid. Contact your dietitian for more information. WHERE CAN I FIND MORE INFORMATION? National Heart, Lung, and Blood Institute: travelstabloid.com Document Released: 08/09/2011 Document Revised: 01/04/2014 Document Reviewed: 06/24/2013 [facility] Patient Information 2015 [place], [place]. This information is not intended to replace advice given to you by your health care provider. Make sure you discuss any questions you have with your health care provider.       Losartan Tablets What is this medicine? LOSARTAN (loe SAR tan) is an angiotensin II receptor blocker, also known as an ARB. It treats high blood pressure. It can slow kidney damage in some patients. It may also be used to lower the risk of stroke. This medicine may be used for other purposes; ask your health care provider or pharmacist if you have questions. COMMON BRAND NAME(S): Cozaar What should I tell  my health care provider before I take this medicine? They need to know if you have any of these conditions:  heart failure  kidney or liver disease  an unusual or allergic reaction to losartan, other medicines, foods, dyes, or preservatives  pregnant or trying to get pregnant  breast-feeding How should I use this medicine? Take this drug by mouth. Take it as directed on the prescription label at the same time every day. You can take it with or without food. If it upsets your stomach, take it with food. Keep taking it unless your health  care provider tells you to stop. Talk to your health care provider about the use of this drug in children. While it may be prescribed for children as young as 6 for selected conditions, precautions do apply. Overdosage: If you think you have taken too much of this medicine contact a poison control center or emergency room at once. NOTE: This medicine is only for you. Do not share this medicine with others. What if I miss a dose? If you miss a dose, take it as soon as you can. If it is almost time for your next dose, take only that dose. Do not take double or extra doses. What may interact with this medicine?  blood pressure medicines  diuretics, especially triamterene, spironolactone, or amiloride  fluconazole  NSAIDs, medicines for pain and inflammation, like ibuprofen or naproxen  potassium salts or potassium supplements  rifampin This list may not describe all possible interactions. Give your health care provider a list of all the medicines, herbs, non-prescription drugs, or dietary supplements you use. Also tell them if you smoke, drink alcohol, or use illegal drugs. Some items may interact with your medicine. What should I watch for while using this medicine? Visit your doctor or health care professional for regular checks on your progress. Check your blood pressure as directed. Ask your doctor or health care professional what your blood pressure should be and when you should contact him or her. Call your doctor or health care professional if you notice an irregular or fast heart beat. Women should inform their doctor if they wish to become pregnant or think they might be pregnant. There is a potential for serious side effects to an unborn child, particularly in the second or third trimester. Talk to your health care professional or pharmacist for more information. You may get drowsy or dizzy. Do not drive, use machinery, or do anything that needs mental alertness until you know how this  drug affects you. Do not stand or sit up quickly, especially if you are an older patient. This reduces the risk of dizzy or fainting spells. Alcohol can make you more drowsy and dizzy. Avoid alcoholic drinks. Avoid salt substitutes unless you are told otherwise by your doctor or health care professional. Do not treat yourself for coughs, colds, or pain while you are taking this medicine without asking your doctor or health care professional for advice. Some ingredients may increase your blood pressure. What side effects may I notice from receiving this medicine? Side effects that you should report to your doctor or health care professional as soon as possible:  confusion, dizziness, light headedness or fainting spells  decreased amount of urine passed  difficulty breathing or swallowing, hoarseness, or tightening of the throat  fast or irregular heart beat, palpitations, or chest pain  skin rash, itching  swelling of your face, lips, tongue, hands, or feet Side effects that usually do not require medical attention (  report to your doctor or health care professional if they continue or are bothersome):  cough  decreased sexual function or desire  headache  nasal congestion or stuffiness  nausea or stomach pain  sore or cramping muscles This list may not describe all possible side effects. Call your doctor for medical advice about side effects. You may report side effects to FDA at 1-800-FDA-1088. Where should I keep my medicine? Keep out of the reach of children and pets. Store at room temperature between 15 and 30 degrees C (59 and 86 degrees F). Protect from light. Keep the container tightly closed. Throw away any unused drug after the expiration date. NOTE: This sheet is a summary. It may not cover all possible information. If you have questions about this medicine, talk to your doctor, pharmacist, or health care provider.  2020 Elsevier/Gold Standard (2019-03-25 12:12:28)

## 2020-05-18 ENCOUNTER — Ambulatory Visit
Admission: RE | Admit: 2020-05-18 | Discharge: 2020-05-18 | Disposition: A | Discharge status: Home / Self Care | Payer: BC Managed Care – PPO | Source: Ambulatory Visit | Attending: Adult Health | Admitting: Adult Health

## 2020-05-18 DIAGNOSIS — R7989 Other specified abnormal findings of blood chemistry: Secondary | ICD-10-CM

## 2020-05-18 DIAGNOSIS — R109 Unspecified abdominal pain: Secondary | ICD-10-CM

## 2020-05-18 DIAGNOSIS — R0989 Other specified symptoms and signs involving the circulatory and respiratory systems: Secondary | ICD-10-CM

## 2020-05-18 LAB — COMPLETE METABOLIC PANEL WITH GFR
AG Ratio: 1.4 (calc) (ref 1.0–2.5)
ALT: 215 U/L — ABNORMAL HIGH (ref 9–46)
AST: 138 U/L — ABNORMAL HIGH (ref 10–40)
Albumin: 4.7 g/dL (ref 3.6–5.1)
Alkaline phosphatase (APISO): 85 U/L (ref 36–130)
BUN: 9 mg/dL (ref 7–25)
CO2: 28 mmol/L (ref 20–32)
Calcium: 10 mg/dL (ref 8.6–10.3)
Chloride: 95 mmol/L — ABNORMAL LOW (ref 98–110)
Creat: 1 mg/dL (ref 0.60–1.35)
GFR, Est African American: 113 mL/min/{1.73_m2} (ref >=60)
GFR, Est Non African American: 97 mL/min/{1.73_m2} (ref >=60)
Globulin: 3.3 g/dL (calc) (ref 1.9–3.7)
Glucose, Bld: 107 mg/dL — ABNORMAL HIGH (ref 65–99)
Potassium: 4 mmol/L (ref 3.5–5.3)
Sodium: 135 mmol/L (ref 135–146)
Total Bilirubin: 1 mg/dL (ref 0.2–1.2)
Total Protein: 8 g/dL (ref 6.1–8.1)

## 2020-05-18 LAB — MAGNESIUM: Magnesium: 2 mg/dL (ref 1.5–2.5)

## 2020-05-18 LAB — HEPATITIS PANEL, ACUTE
Hep A IgM: NONREACTIVE
Hep B C IgM: NONREACTIVE
Hepatitis B Surface Ag: NONREACTIVE
Hepatitis C Ab: NONREACTIVE
SIGNAL TO CUT-OFF: 0.05 (ref <1.00)

## 2020-05-18 LAB — FERRITIN: Ferritin: 576 ng/mL — ABNORMAL HIGH (ref 38–380)

## 2020-05-18 LAB — IRON: Iron: 174 ug/dL (ref 50–180)

## 2020-05-20 ENCOUNTER — Telehealth: Payer: Self-pay | Admitting: Adult Health

## 2020-05-20 ENCOUNTER — Other Ambulatory Visit: Payer: Self-pay | Admitting: Adult Health

## 2020-05-20 DIAGNOSIS — R7989 Other specified abnormal findings of blood chemistry: Secondary | ICD-10-CM

## 2020-05-20 DIAGNOSIS — R932 Abnormal findings on diagnostic imaging of liver and biliary tract: Secondary | ICD-10-CM

## 2020-05-20 NOTE — Telephone Encounter (Signed)
Spoke with patient at length about his recent LFT workup and results. He states established with Dr. Collene Mares and requests GI referral that was recommended be directed to her.

## 2020-05-22 ENCOUNTER — Other Ambulatory Visit: Payer: Self-pay | Admitting: Adult Health

## 2020-06-09 ENCOUNTER — Encounter: Payer: Self-pay | Admitting: Internal Medicine

## 2020-06-10 NOTE — Progress Notes (Signed)
Assessment and Plan:  Timothy Rivers was seen today for follow-up.  Diagnoses and all orders for this visit:  Labile hypertension Great progress with lifestyle and fairly controlled values off of medications Do note diastolic tending to run high, atypical from historical, reports has reduced sodium intake dramatically, glucose has been normal  Has upcoming visit with Dr. Lovena Le, encouraged to discuss Recommend restarting HCTZ if trending up Monitor blood pressure at home; call if consistently over 130/80 Discussed DASH diet Advised to go to the ER if any CP, SOB, nausea, dizziness, severe HA, changes vision/speech, left arm numbness and tingling and jaw pain. Follow up in 4 weeks; also follows with Dr. Lovena Le - if poor response despite dual agents, may indicate u -     COMPLETE METABOLIC PANEL WITH GFR -     Magnesium -     losartan (COZAAR) 50 MG tablet; Take 1 tablet (50 mg total) by mouth daily.  Elevated LFTs Improved on recent check by GI Suspect fatty liver Continue with lifestyle changes and excellent weight loss progress  Elevated ANA Discussed at length today; borderline; homogenous pattern -  Discussed common false positives, non-specific test Family history of RA; he denies any concerning sx of lupus; renal functions, urine has been normal; discussed anti double stranded DNA but after discussion due to low clinical suspicion will defer for now; will pursue in the future if concerning sx   Further disposition pending results of labs. Discussed med's effects and SE's.   Over 15 minutes of exam, counseling, chart review, and critical decision making was performed.   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   Recently increased HCTZ and advised start losartan if remained above goal.   He reports dramatically changed lifestyle, has cut out all processed food, no alcohol since last visit, has restarted exercise, weight down 19 lb  His blood pressure has been controlled at home (110/80s), reports he stopped HCTZ due to well controlled values, never started losartan, today their BP is BP: 108/86 He denies chest pain, shortness of breath, dizziness, HA, edema.  Reports rare palpitations, without accompaniments.  Had recent normal CXR 05/2020  BP Readings from Last 3 Encounters:  06/14/20 108/86  05/17/20 (!) 140/100  04/11/20 (!) 130/106     BMI is Body mass index is 29.7 kg/m., he has not been working on diet and exercise. Wt Readings from Last 3 Encounters:  06/14/20 219 lb (99.3 kg)  05/17/20 238 lb (108 kg)  04/11/20 239 lb 12.8 oz (108.8 kg)   He was also found to have elevated LFTs, normal hepatitis, serum iron but elevated ferritin; US showed diffuse steatosis vs other disease, non-diagnostic and was referred to Dr. Collene Mares, had labs showing borderline ANA 1:80 in homogenous pattern, alpha 1 antitrypsin was negative, normal ceruloplasmin, anti smooth/actin.  Noted dramatic improvement with lifestyle change, felt likely fatty liver, was encouraged to continue Lab Results  Component Value Date   ALT 215 (H) 05/17/2020   AST 138 (H) 05/17/2020   ALKPHOS 44 02/01/2016   BILITOT 1.0 05/17/2020    Past Medical History:  Diagnosis Date  . Hernia, umbilical   . 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   No current facility-administered medications on file prior to visit.    ROS: Review of Systems  Constitutional: Negative for chills, diaphoresis, fever and malaise/fatigue.  HENT:  Negative.   Eyes: Negative.   Respiratory: Negative.   Cardiovascular: Negative.   Gastrointestinal: Negative.   Genitourinary: Negative.   Musculoskeletal: Negative for back pain, joint pain and myalgias.  Skin: Negative for rash.  Neurological: Negative.   Psychiatric/Behavioral: Negative.      Physical Exam:  BP 108/86   Pulse 99   Temp 97.7 F (36.5 C)   Wt 219 lb (99.3 kg)   SpO2 98%   BMI 29.70 kg/m   General Appearance: Well nourished, in no apparent distress. Eyes: PERRLA, EOMs, conjunctiva no swelling or erythema Sinuses: No Frontal/maxillary tenderness ENT/Mouth: Ext aud canals clear, TMs without erythema, bulging. No erythema, swelling, or exudate on post pharynx.  Tonsils not swollen or erythematous. Hearing normal.  Neck: Supple, thyroid normal.  Respiratory: Respiratory effort normal, BS equal bilaterally without rales, rhonchi, wheezing or stridor.  Cardio: RRR with no MRGs, normal S1/S2. Brisk peripheral pulses without edema.  Abdomen: Soft, + BS.  Non tender, no guarding, rebound, hernias, masses. Lymphatics: Non tender without lymphadenopathy.  Musculoskeletal: Full ROM, 5/5 strength, normal gait.  Skin: Warm, dry without rashes, lesions, ecchymosis.  Neuro: Cranial nerves intact. Normal muscle tone, no cerebellar symptoms. Sensation intact.  Psych: Awake and oriented X 3, normal affect, Insight and Judgment appropriate.     Izora Ribas, NP 9:25 AM [facility]

## 2020-06-14 ENCOUNTER — Ambulatory Visit (INDEPENDENT_AMBULATORY_CARE_PROVIDER_SITE_OTHER): Payer: BC Managed Care – PPO | Admitting: Adult Health

## 2020-06-14 ENCOUNTER — Other Ambulatory Visit: Payer: Self-pay

## 2020-06-14 ENCOUNTER — Encounter: Payer: Self-pay | Admitting: Adult Health

## 2020-06-14 VITALS — BP 108/86 | HR 82 | Temp 97.7°F | Wt 219.0 lb

## 2020-06-14 DIAGNOSIS — E663 Overweight: Secondary | ICD-10-CM | POA: Diagnosis not present

## 2020-06-14 DIAGNOSIS — R0989 Other specified symptoms and signs involving the circulatory and respiratory systems: Secondary | ICD-10-CM | POA: Diagnosis not present

## 2020-06-14 NOTE — Patient Instructions (Addendum)
Goals    . Blood Pressure < 120/80    . Weight (lb) < 200 lb (90.7 kg)        HYPERTENSION INFORMATION  Monitor your blood pressure at home, please keep a record and bring that in with you to your next office visit.   Go to the ER if any CP, SOB, nausea, dizziness, severe HA, changes vision/speech  Testing/Procedures: HOW TO TAKE YOUR BLOOD PRESSURE:  Rest 5 minutes before taking your blood pressure.  Don't smoke or drink caffeinated beverages for at least 30 minutes before.  Take your blood pressure before (not after) you eat.  Sit comfortably with your back supported and both feet on the floor (don't cross your legs).  Elevate your arm to heart level on a table or a desk.  Use the proper sized cuff. It should fit smoothly and snugly around your bare upper arm. There should be enough room to slip a fingertip under the cuff. The bottom edge of the cuff should be 1 inch above the crease of the elbow.  Due to a recent study, SPRINT, we have changed our goal for the systolic or top blood pressure number. Ideally we want your top number at 120.  In the [facility] Trial, 5000 people were randomized to a goal BP of 120 and 5000 people were randomized to a goal BP of less than 140. The patients with the goal BP at 120 had LESS DEMENTIA, LESS HEART ATTACKS, AND LESS STROKES, AS WELL AS OVERALL DECREASED MORTALITY OR DEATH RATE.   There was another study that showed taking your blood pressure medications at night decrease cardiovascular events.  However if you are on a fluid pill, please take this in the morning.   If you are willing, our goal BP is the top number of 120.   Your most recent BP: BP: 108/86   Take your medications faithfully as instructed. Maintain a healthy weight. Get at least 150 minutes of aerobic exercise per week. Minimize salt intake. Minimize alcohol intake  DASH Eating Plan DASH stands for "Dietary Approaches to Stop Hypertension." The DASH eating plan is a  healthy eating plan that has been shown to reduce high blood pressure (hypertension). Additional health benefits may include reducing the risk of type 2 diabetes mellitus, heart disease, and stroke. The DASH eating plan may also help with weight loss. WHAT DO I NEED TO KNOW ABOUT THE DASH EATING PLAN? For the DASH eating plan, you will follow these general guidelines:  Choose foods with a percent daily value for sodium of less than 5% (as listed on the food label).  Use salt-free seasonings or herbs instead of table salt or sea salt.  Check with your health care provider or pharmacist before using salt substitutes.  Eat lower-sodium products, often labeled as "lower sodium" or "no salt added."  Eat fresh foods.  Eat more vegetables, fruits, and low-fat dairy products.  Choose whole grains. Look for the word "whole" as the first word in the ingredient list.  Choose fish and skinless chicken or [place] more often than red meat. Limit fish, poultry, and meat to 6 oz (170 g) each day.  Limit sweets, desserts, sugars, and sugary drinks.  Choose heart-healthy fats.  Limit cheese to 1 oz (28 g) per day.  Eat more home-cooked food and less restaurant, buffet, and fast food.  Limit fried foods.  Cook foods using methods other than frying.  Limit canned vegetables. If you do use them, rinse them  well to decrease the sodium.  When eating at a restaurant, ask that your food be prepared with less salt, or no salt if possible. WHAT FOODS CAN I EAT? Seek help from a dietitian for individual calorie needs. Grains Whole grain or whole wheat bread. Brown rice. Whole grain or whole wheat pasta. Quinoa, bulgur, and whole grain cereals. Low-sodium cereals. Corn or whole wheat flour tortillas. Whole grain cornbread. Whole grain crackers. Low-sodium crackers. Vegetables Fresh or frozen vegetables (raw, steamed, roasted, or grilled). Low-sodium or reduced-sodium tomato and vegetable juices. Low-sodium  or reduced-sodium tomato sauce and paste. Low-sodium or reduced-sodium canned vegetables.  Fruits All fresh, canned (in natural juice), or frozen fruits. Meat and Other Protein Products Ground beef (85% or leaner), grass-fed beef, or beef trimmed of fat. Skinless chicken or [place]. Ground chicken or [place]. Pork trimmed of fat. All fish and seafood. Eggs. Dried beans, peas, or lentils. Unsalted nuts and seeds. Unsalted canned beans. Dairy Low-fat dairy products, such as skim or 1% milk, 2% or reduced-fat cheeses, low-fat ricotta or cottage cheese, or plain low-fat yogurt. Low-sodium or reduced-sodium cheeses. Fats and Oils Tub margarines without trans fats. Light or reduced-fat mayonnaise and salad dressings (reduced sodium). Avocado. Safflower, olive, or canola oils. Natural peanut or almond butter. Other Unsalted popcorn and pretzels. The items listed above may not be a complete list of recommended foods or beverages. Contact your dietitian for more options. WHAT FOODS ARE NOT RECOMMENDED? Grains White bread. White pasta. White rice. Refined cornbread. Bagels and croissants. Crackers that contain trans fat. Vegetables Creamed or fried vegetables. Vegetables in a cheese sauce. Regular canned vegetables. Regular canned tomato sauce and paste. Regular tomato and vegetable juices. Fruits Dried fruits. Canned fruit in light or heavy syrup. Fruit juice. Meat and Other Protein Products Fatty cuts of meat. Ribs, chicken wings, bacon, sausage, bologna, salami, chitterlings, fatback, hot dogs, bratwurst, and packaged luncheon meats. Salted nuts and seeds. Canned beans with salt. Dairy Whole or 2% milk, cream, half-and-half, and cream cheese. Whole-fat or sweetened yogurt. Full-fat cheeses or blue cheese. Nondairy creamers and whipped toppings. Processed cheese, cheese spreads, or cheese curds. Condiments Onion and garlic salt, seasoned salt, table salt, and sea salt. Canned and packaged gravies.  Worcestershire sauce. Tartar sauce. Barbecue sauce. Teriyaki sauce. Soy sauce, including reduced sodium. Steak sauce. Fish sauce. Oyster sauce. Cocktail sauce. Horseradish. Ketchup and mustard. Meat flavorings and tenderizers. Bouillon cubes. Hot sauce. Tabasco sauce. Marinades. Taco seasonings. Relishes. Fats and Oils Butter, stick margarine, lard, shortening, ghee, and bacon fat. Coconut, palm kernel, or palm oils. Regular salad dressings. Other Pickles and olives. Salted popcorn and pretzels. The items listed above may not be a complete list of foods and beverages to avoid. Contact your dietitian for more information. WHERE CAN I FIND MORE INFORMATION? National Heart, Lung, and Blood Institute: travelstabloid.com Document Released: 08/09/2011 Document Revised: 01/04/2014 Document Reviewed: 06/24/2013 [facility] Patient Information 2015 [place], [place]. This information is not intended to replace advice given to you by your health care provider. Make sure you discuss any questions you have with your health care provider.

## 2020-06-24 ENCOUNTER — Ambulatory Visit: Payer: BC Managed Care – PPO | Admitting: Internal Medicine

## 2020-06-24 ENCOUNTER — Other Ambulatory Visit: Payer: Self-pay

## 2020-06-24 ENCOUNTER — Encounter: Payer: Self-pay | Admitting: Internal Medicine

## 2020-06-24 VITALS — BP 120/82 | HR 87 | Ht 72.0 in | Wt 217.0 lb

## 2020-06-24 DIAGNOSIS — I456 Pre-excitation syndrome: Secondary | ICD-10-CM | POA: Diagnosis not present

## 2020-06-24 NOTE — Patient Instructions (Addendum)
Medication Instructions:  Your physician recommends that you continue on your current medications as directed. Please refer to the Current Medication list given to you today.  Labwork: None ordered.  Testing/Procedures: None ordered.  Follow-Up: Your physician wants you to follow-up in: as needed with Dr. Taylor.      Any Other Special Instructions Will Be Listed Below (If Applicable).  If you need a refill on your cardiac medications before your next appointment, please call your pharmacy.   

## 2020-06-24 NOTE — Progress Notes (Signed)
HPI Timothy Rivers returns today for followup. 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 He has lost weight and feels better. His bp is down. He has not had any symptoms of SVT. He has no limit to physical activity. No Known Allergies   No current outpatient medications on file.   No current facility-administered medications for this visit.     Past Medical History:  Diagnosis Date  . Hernia, umbilical   . 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   History reviewed. No pertinent surgical history.   Family History  Problem Relation Age of Onset  . Diabetes Maternal Aunt   . Diabetes Maternal Uncle   . Cancer Maternal Grandfather      Social History   Socioeconomic History  . Marital status: Single    Spouse name: Not on file  . Number of children: Not on file  . Years of education: Not on file  . Highest education level: Not on file  Occupational History  . Not on file  Tobacco Use  . Smoking status: Former Smoker    Types: Cigars    Quit date: 08/28/2019    Years since quitting: 0.8  . Smokeless tobacco: Never Used  Vaping Use  . Vaping Use: Never used  Substance and Sexual Activity  . Alcohol use: Yes    Alcohol/week: 5.0 standard drinks    Types: 5 Cans of beer per week  . Drug use: Yes    Types: Marijuana  . Sexual activity: Yes  Other Topics Concern  . Not on file  Social History Narrative  . Not on file   Social Determinants of Health   Financial Resource Strain:   . Difficulty of Paying Living Expenses: Not on file  Food Insecurity:   . Worried About Charity fundraiser in the Last Year: Not on file  . Ran Out of Food in the Last Year: Not on file  Transportation Needs:   . Lack of Transportation (Medical): Not on file  . Lack of Transportation (Non-Medical): Not on file  Physical Activity:   . Days of Exercise  per Week: Not on file  . Minutes of Exercise per Session: Not on file  Stress:   . Feeling of Stress : Not on file  Social Connections:   . Frequency of Communication with Friends and Family: Not on file  . Frequency of Social Gatherings with Friends and Family: Not on file  . Attends Religious Services: Not on file  . Active Member of Clubs or Organizations: Not on file  . Attends Archivist Meetings: Not on file  . Marital Status: Not on file  Intimate Partner Violence:   . Fear of Current or Ex-Partner: Not on file  . Emotionally Abused: Not on file  . Physically Abused: Not on file  . Sexually Abused: Not on file     BP 120/82   Pulse 87   Ht 6' (1.829 m)   Wt 217 lb (98.4 kg)   SpO2 97%   BMI 29.43 kg/m   Physical Exam:  Well appearing 34 yo man, NAD HEENT: Unremarkable Neck:  No JVD, no thyromegally Lymphatics:  No adenopathy Back:  No CVA tenderness Lungs:  Clear with no wheezes HEART:  Regular rate rhythm, no murmurs, no rubs, no clicks  Abd:  soft, positive bowel sounds, no organomegally, no rebound, no guarding Ext:  2 plus pulses, no edema, no cyanosis, no clubbing Skin:  No rashes no nodules Neuro:  CN II through XII intact, motor grossly intact  EKG - nsr  Assess/Plan: 1. Abnormal ECG - his WPW pattern has resolved and he is asymptomatic. He will undergo watchful waiting. 2. HTN -his bp has come down with weight loss. I asked him to continue with weight loss and a low sodium diet and reduced ETOH. 3. Chest pressure -resolved.  Timothy Overlie Jalisa Sacco,MD

## 2020-06-28 ENCOUNTER — Encounter: Payer: Self-pay | Admitting: Adult Health

## 2020-06-28 ENCOUNTER — Other Ambulatory Visit: Payer: Self-pay

## 2020-06-28 ENCOUNTER — Ambulatory Visit (INDEPENDENT_AMBULATORY_CARE_PROVIDER_SITE_OTHER): Payer: BC Managed Care – PPO | Admitting: Adult Health

## 2020-06-28 VITALS — BP 116/82 | HR 86 | Temp 97.3°F | Wt 217.0 lb

## 2020-06-28 DIAGNOSIS — R0989 Other specified symptoms and signs involving the circulatory and respiratory systems: Secondary | ICD-10-CM | POA: Diagnosis not present

## 2020-06-28 DIAGNOSIS — E663 Overweight: Secondary | ICD-10-CM | POA: Diagnosis not present

## 2020-06-28 DIAGNOSIS — L0211 Cutaneous abscess of neck: Secondary | ICD-10-CM | POA: Insufficient documentation

## 2020-06-28 MED ORDER — DOXYCYCLINE HYCLATE 100 MG PO CAPS: ORAL_CAPSULE | ORAL | 0 refills | Status: DC

## 2020-06-28 NOTE — Progress Notes (Signed)
Assessment and Plan:  Nature was seen today for acute visit.  Diagnoses and all orders for this visit:  Abscess of skin of neck I & D procedure - after verbal consent and review of allergies, prepped with betadine, injected 1% lidocaine and lanced with 11 blade. Expressed small-moderate amount of bloody purulent discharge with immediate relieve in local pressure but without full resolution of local swelling. Applied topical abx ointment and applied gauze/tape dressing. Tolerated procedure well without complication.  Advised to keep dry for 24-48 hours, but monitor closely for any local signs of infection, erythema, tenderness, swelling. Do regular dressing changes.  Oral antibiotics -- see med orders. FU with PCP in 7 days or sooner if any concerns -     WOUND CULTURE -     doxycycline (VIBRAMYCIN) 100 MG capsule; Take 1 capsule 2 x/day with food for 10 days  Further disposition pending results of labs. Discussed med's effects and SE's.   Over 20 minutes of exam, counseling, chart review, and critical decision making was performed.   Future Appointments  Date Time Provider [place]  07/06/2020  4:00 PM [name], NP [facility] None  08/19/2020 10:00 AM [name], MD [facility] None    ------------------------------------------------------------------------------------------------------------------   HPI BP 116/82   Pulse 86   Temp (!) 97.3 F (36.3 C)   Wt 217 lb (98.4 kg)   SpO2 97%   BMI 29.43 kg/m   34 y.o.male presents for evaluation of lump that appeared to R upper neck underneath jaw, without erythema but with some tenderness, thought possible ingrown hair but did try hot compresses without change until last week has been larger and appeared inflamed. Describes local pressure sensation. Denies fever/chills.   Past Medical History:  Diagnosis Date  . Hernia, umbilical   . 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   No current facility-administered medications on file prior to visit.    ROS: all negative except above.   Physical Exam:  BP 116/82   Pulse 86   Temp (!) 97.3 F (36.3 C)   Wt 217 lb (98.4 kg)   SpO2 97%   BMI 29.43 kg/m   General Appearance: Well nourished, in no apparent distress. Eyes: PERRLA, EOMs, conjunctiva no swelling or erythema Sinuses: No Frontal/maxillary tenderness ENT/Mouth: Ext aud canals clear, TMs without erythema, bulging. No erythema, swelling, or exudate on post pharynx.  Tonsils not swollen or erythematous. Hearing normal.  Neck: Supple, thyroid normal.  Respiratory: Respiratory effort normal, BS equal bilaterally without rales, rhonchi, wheezing or stridor.  Cardio: RRR with no MRGs. Brisk peripheral pulses without edema.  Abdomen: Soft, + BS.  Lymphatics: Non tender without lymphadenopathy.  Musculoskeletal: normal gait.  Skin: Warm, dry; has 1.5 cm area to right upper neck superficially without erythema, obvious wound or heat but with fluctuance.  Neuro: Cranial nerves intact. Normal muscle tone, no cerebellar symptoms. Sensation intact.  Psych: Awake and oriented X 3, normal affect, Insight and Judgment appropriate.     Timothy Ribas, NP 5:17 PM [facility]

## 2020-06-28 NOTE — Patient Instructions (Signed)
Skin Abscess  A skin abscess is an infected area on or under your skin that contains a collection of pus and other material. An abscess may also be called a furuncle, carbuncle, or boil. An abscess can occur in or on almost any part of your body. Some abscesses break open (rupture) on their own. Most continue to get worse unless they are treated. The infection can spread deeper into the body and eventually into your blood, which can make you feel ill. Treatment usually involves draining the abscess. What are the causes? An abscess occurs when germs, like bacteria, pass through your skin and cause an infection. This may be caused by:  A scrape or cut on your skin.  A puncture wound through your skin, including a needle injection or insect bite.  Blocked oil or sweat glands.  Blocked and infected hair follicles.  A cyst that forms beneath your skin (sebaceous cyst) and becomes infected. What increases the risk? This condition is more likely to develop in people who:  Have a weak body defense system (immune system).  Have diabetes.  Have dry and irritated skin.  Get frequent injections or use illegal IV drugs.  Have a foreign body in a wound, such as a splinter.  Have problems with their lymph system or veins. What are the signs or symptoms? Symptoms of this condition include:  A painful, firm bump under the skin.  A bump with pus at the top. This may break through the skin and drain. Other symptoms include:  Redness surrounding the abscess site.  Warmth.  Swelling of the lymph nodes (glands) near the abscess.  Tenderness.  A sore on the skin. How is this diagnosed? This condition may be diagnosed based on:  A physical exam.  Your medical history.  A sample of pus. This may be used to find out what is causing the infection.  Blood tests.  Imaging tests, such as an ultrasound, CT scan, or MRI. How is this treated? A small abscess that drains on its own may not  need treatment. Treatment for larger abscesses may include:  Moist heat or heat pack applied to the area several times a day.  A procedure to drain the abscess (incision and drainage).  Antibiotic medicines. For a severe abscess, you may first get antibiotics through an IV and then change to antibiotics by mouth. Follow these instructions at home: Medicines   Take over-the-counter and prescription medicines only as told by your health care provider.  If you were prescribed an antibiotic medicine, take it as told by your health care provider. Do not stop taking the antibiotic even if you start to feel better. Abscess care   If you have an abscess that has not drained, apply heat to the affected area. Use the heat source that your health care provider recommends, such as a moist heat pack or a heating pad. ? Place a towel between your skin and the heat source. ? Leave the heat on for 20-30 minutes. ? Remove the heat if your skin turns bright red. This is especially important if you are unable to feel pain, heat, or cold. You may have a greater risk of getting burned.  Follow instructions from your health care provider about how to take care of your abscess. Make sure you: ? Cover the abscess with a bandage (dressing). ? Change your dressing or gauze as told by your health care provider. ? Wash your hands with soap and water before you change the  dressing or gauze. If soap and water are not available, use hand sanitizer.  Check your abscess every day for signs of a worsening infection. Check for: ? More redness, swelling, or pain. ? More fluid or blood. ? Warmth. ? More pus or a bad smell. General instructions  To avoid spreading the infection: ? Do not share personal care items, towels, or hot tubs with others. ? Avoid making skin contact with other people.  Keep all follow-up visits as told by your health care provider. This is important. Contact a health care provider if you  have:  More redness, swelling, or pain around your abscess.  More fluid or blood coming from your abscess.  Warm skin around your abscess.  More pus or a bad smell coming from your abscess.  A fever.  Muscle aches.  Chills or a general ill feeling. Get help right away if you:  Have severe pain.  See red streaks on your skin spreading away from the abscess. Summary  A skin abscess is an infected area on or under your skin that contains a collection of pus and other material.  A small abscess that drains on its own may not need treatment.  Treatment for larger abscesses may include having a procedure to drain the abscess and taking an antibiotic. This information is not intended to replace advice given to you by your health care provider. Make sure you discuss any questions you have with your health care provider. Document Revised: 12/11/2018 Document Reviewed: 10/03/2017 Elsevier Patient Education  [place].     \Doxycycline tablets or capsules What is this medicine? DOXYCYCLINE (dox i SYE kleen) is a tetracycline antibiotic. It kills certain bacteria or stops their growth. It is used to treat many kinds of infections, like dental, skin, respiratory, and urinary tract infections. It also treats acne, Lyme disease, malaria, and certain sexually transmitted infections. This medicine may be used for other purposes; ask your health care provider or pharmacist if you have questions. COMMON BRAND NAME(S): Acticlate, Adoxa, Adoxa CK, Adoxa Pak, Adoxa TT, Alodox, Avidoxy, Doxal, LYMEPAK, Mondoxyne NL, Monodox, Morgidox 1x, Morgidox 1x Kit, Morgidox 2x, Morgidox 2x Kit, NutriDox, Ocudox, [place], [place], Vibra-Tabs, Vibramycin What should I tell my health care provider before I take this medicine? They need to know if you have any of these conditions:  liver disease  long exposure to sunlight like working outdoors  stomach problems like colitis  an unusual or allergic  reaction to doxycycline, tetracycline antibiotics, other medicines, foods, dyes, or preservatives  pregnant or trying to get pregnant  breast-feeding How should I use this medicine? Take this medicine by mouth with a full glass of water. Follow the directions on the prescription label. It is best to take this medicine without food, but if it upsets your stomach take it with food. Take your medicine at regular intervals. Do not take your medicine more often than directed. Take all of your medicine as directed even if you think you are better. Do not skip doses or stop your medicine early. Talk to your pediatrician regarding the use of this medicine in children. While this drug may be prescribed for selected conditions, precautions do apply. Overdosage: If you think you have taken too much of this medicine contact a poison control center or emergency room at once. NOTE: This medicine is only for you. Do not share this medicine with others. What if I miss a dose? If you miss a dose, take it as soon as  you can. If it is almost time for your next dose, take only that dose. Do not take double or extra doses. What may interact with this medicine?  antacids  barbiturates  birth control pills  bismuth subsalicylate  carbamazepine  methoxyflurane  other antibiotics  phenytoin  vitamins that contain iron  warfarin This list may not describe all possible interactions. Give your health care provider a list of all the medicines, herbs, non-prescription drugs, or dietary supplements you use. Also tell them if you smoke, drink alcohol, or use illegal drugs. Some items may interact with your medicine. What should I watch for while using this medicine? Tell your doctor or health care professional if your symptoms do not improve. Do not treat diarrhea with over the counter products. Contact your doctor if you have diarrhea that lasts more than 2 days or if it is severe and watery. Do not take this  medicine just before going to bed. It may not dissolve properly when you lay down and can cause pain in your throat. Drink plenty of fluids while taking this medicine to also help reduce irritation in your throat. This medicine can make you more sensitive to the sun. Keep out of the sun. If you cannot avoid being in the sun, wear protective clothing and use sunscreen. Do not use sun lamps or tanning beds/booths. Birth control pills may not work properly while you are taking this medicine. Talk to your doctor about using an extra method of birth control. If you are being treated for a sexually transmitted infection, avoid sexual contact until you have finished your treatment. Your sexual partner may also need treatment. Avoid antacids, aluminum, calcium, magnesium, and iron products for 4 hours before and 2 hours after taking a dose of this medicine. If you are using this medicine to prevent malaria, you should still protect yourself from contact with mosquitos. Stay in screened-in areas, use mosquito nets, keep your body covered, and use an insect repellent. What side effects may I notice from receiving this medicine? Side effects that you should report to your doctor or health care professional as soon as possible:  allergic reactions like skin rash, itching or hives, swelling of the face, lips, or tongue  difficulty breathing  fever  itching in the rectal or genital area  pain on swallowing  rash, fever, and swollen lymph nodes  redness, blistering, peeling or loosening of the skin, including inside the mouth  severe stomach pain or cramps  unusual bleeding or bruising  unusually weak or tired  yellowing of the eyes or skin Side effects that usually do not require medical attention (report to your doctor or health care professional if they continue or are bothersome):  diarrhea  loss of appetite  nausea, vomiting This list may not describe all possible side effects. Call your  doctor for medical advice about side effects. You may report side effects to FDA at 1-800-FDA-1088. Where should I keep my medicine? Keep out of the reach of children. Store at room temperature, below 30 degrees C (86 degrees F). Protect from light. Keep container tightly closed. Throw away any unused medicine after the expiration date. Taking this medicine after the expiration date can make you seriously ill. NOTE: This sheet is a summary. It may not cover all possible information. If you have questions about this medicine, talk to your doctor, pharmacist, or health care provider.  2020 Elsevier/Gold Standard (2018-11-20 13:44:53)

## 2020-07-01 LAB — WOUND CULTURE
MICRO NUMBER:: 11123968
SPECIMEN QUALITY:: ADEQUATE

## 2020-07-05 NOTE — Progress Notes (Signed)
Assessment and Plan:  Timothy Rivers was seen today for acute visit.  Diagnoses and all orders for this visit:  Abscess of skin of neck S/p I&D last visit, much improved; no concerning signs of infection or complication; complete abx and monitor May be sebaceous cyst remaining; if recurrent infection, irritation, discussed would recommend excision by Dr. Melford Aase due to neck/high risk location He is in agreement and will follow up as scheduled Follow up sooner if any concern for recurrence  -     doxycycline (VIBRAMYCIN) 100 MG capsule; Take 1 capsule 2 x/day with food for 10 day  Further disposition pending results of labs. Discussed med's effects and SE's.   Over 20 minutes of exam, counseling, chart review, and critical decision making was performed.   Future Appointments  Date Time Provider [place]  08/19/2020 10:00 AM [name], MD [facility] None    ------------------------------------------------------------------------------------------------------------------   HPI BP 120/80   Pulse 75   Temp (!) 97.5 F (36.4 C)   Wt 216 lb (98 kg)   SpO2 98%   BMI 29.29 kg/m   34 y.o.male presents for follow up on abscess vs infected cyst of R neck that was I&D'd and cultured on 06/28/2020. He was prescribed course of doxycycline pending culture, which showed light growth of lugdunensis without resistance.   He reports excellent progress, no tenderness, erythema, lump appears to be steadily shrinking.   Past Medical History:  Diagnosis Date  . Hernia, umbilical   . 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 doxycycline (VIBRAMYCIN) 100 MG capsule Take 1 capsule 2 x/day with food for 10 days   No current facility-administered medications on file prior to visit.    ROS: all negative except above.   Physical Exam:  BP 120/80   Pulse 75   Temp (!) 97.5 F (36.4 C)   Wt 216 lb (98  kg)   SpO2 98%   BMI 29.29 kg/m   General Appearance: Well nourished, in no apparent distress. Eyes: PERRLA, EOMs, conjunctiva no swelling or erythema Sinuses: No Frontal/maxillary tenderness ENT/Mouth: Ext aud canals clear, TMs without erythema, bulging. No erythema, swelling, or exudate on post pharynx.  Tonsils not swollen or erythematous. Hearing normal.  Neck: Supple, thyroid normal.  Respiratory: Respiratory effort normal, BS equal bilaterally without rales, rhonchi, wheezing or stridor.  Cardio: RRR with no MRGs. Brisk peripheral pulses without edema.  Abdomen: Soft, + BS.  Lymphatics: Non tender without lymphadenopathy.  Musculoskeletal: normal gait.  Skin: Warm, dry; has approx 0.5-0.7 mm area to right upper neck superficially without erythema, tenderness, heat or fluctuance.  Neuro: Cranial nerves intact. Normal muscle tone, no cerebellar symptoms. Sensation intact.  Psych: Awake and oriented X 3, normal affect, Insight and Judgment appropriate.     Timothy Ribas, NP 4:21 PM [facility]

## 2020-07-06 ENCOUNTER — Ambulatory Visit (INDEPENDENT_AMBULATORY_CARE_PROVIDER_SITE_OTHER): Payer: BC Managed Care – PPO | Admitting: Adult Health

## 2020-07-06 ENCOUNTER — Encounter: Payer: Self-pay | Admitting: Adult Health

## 2020-07-06 ENCOUNTER — Other Ambulatory Visit: Payer: Self-pay

## 2020-07-06 VITALS — BP 120/80 | HR 75 | Temp 97.5°F | Wt 216.0 lb

## 2020-07-06 DIAGNOSIS — L0211 Cutaneous abscess of neck: Secondary | ICD-10-CM

## 2020-07-06 DIAGNOSIS — I10 Essential (primary) hypertension: Secondary | ICD-10-CM

## 2020-07-06 NOTE — Patient Instructions (Signed)
Epidermal Cyst Removal  Epidermal cyst removal is a procedure to remove a sac of oily material (keratin) that has formed under your skin (epidermal cyst). Epidermal cysts may also be called epidermoid cysts, sebaceous cysts, or keratin cysts. Normally, the skin secretes this oily material through a gland or a hair follicle. However, when a skin gland or hair follicle becomes blocked, an epidermal cyst can form. You may need this procedure if you have an epidermal cyst that becomes large, uncomfortable, or infected. Tell a health care provider about:  Any allergies you have.  All medicines you are taking, including vitamins, herbs, eye drops, creams, and over-the-counter medicines.  Any problems you or family members have had with anesthetic medicines.  Any blood disorders you have.  Any surgeries you have had.  Any medical conditions you have now or have had.  Whether you are pregnant or may be pregnant. What are the risks? Generally, this is a safe procedure. However, problems may occur, including:  Development of another cyst.  Bleeding.  Infection.  Scarring. What happens before the procedure?  Ask your health care provider about: ? Changing or stopping your regular medicines. This is especially important if you are taking diabetes medicines or blood thinners. ? Taking medicines such as aspirin and ibuprofen. These medicines can thin your blood. Do not take these medicines unless your health care provider tells you to take them. ? Taking over-the-counter medicines, vitamins, herbs, and supplements.  If you have an infected cyst, you may have to take antibiotic medicine before the cyst removal. Take your antibiotic as told by your health care provider. Do not stop taking the antibiotic even if you start to feel better.  Take a shower on the morning of your procedure. Your health care provider may ask you to use a germ-killing soap. What happens during the  procedure?   You will be given a medicine to numb the area (local anesthetic).  The skin around the cyst will be cleaned with a germ-killing solution.  The health care provider will make a small incision in your skin over the cyst.  The health care provider will separate the cyst from the surrounding tissues that are under your skin.  If possible, the cyst will be removed undamaged (intact).  If the cyst bursts (ruptures), it will be removed in pieces.  After the cyst is removed, the health care provider will control any bleeding and close the incision with small stitches (sutures). Small incisions may not need sutures, and the bleeding will be controlled by applying direct pressure with gauze.  The health care provider may apply antibiotic ointment and a bandage (dressing) over the incision. The procedure may vary among health care providers and hospitals. What happens after the procedure?  If your cyst ruptured during the procedure, you may need an antibiotic. If you are prescribed an antibiotic medicine or ointment, take or apply it as told by your health care provider. Do not stop using the antibiotic even if you start to feel better. Summary  Epidermal cyst removal is a procedure to remove a sac of oily material (keratin) that has formed under your skin (epidermal cyst).  You may need this procedure if you have an epidermal cyst that becomes large, uncomfortable, or infected.  The health care provider will make a small incision in your skin to remove the cyst.  If you are prescribed an antibiotic medicine before the procedure, after the procedure, or both, use the antibiotic as  told by your health care provider. Do not stop using the antibiotic even if you start to feel better. This information is not intended to replace advice given to you by your health care provider. Make sure you discuss any questions you have with your health care provider. Document Revised: 12/11/2018  Document Reviewed: 06/13/2017 Elsevier Patient Education  [place].

## 2020-07-19 ENCOUNTER — Encounter: Payer: BC Managed Care – PPO | Admitting: Internal Medicine

## 2020-08-19 ENCOUNTER — Encounter: Payer: BC Managed Care – PPO | Admitting: Internal Medicine

## 2020-11-21 ENCOUNTER — Telehealth: Payer: Self-pay | Admitting: Internal Medicine

## 2020-11-21 DIAGNOSIS — R072 Precordial pain: Secondary | ICD-10-CM

## 2020-11-21 NOTE — Telephone Encounter (Signed)
Pt c/o of Chest Pain: STAT if CP now or developed within 24 hours  1. Are you having CP right now? Yes   2. Are you experiencing any other symptoms (ex. SOB, nausea, vomiting, sweating)? No   3. How long have you been experiencing CP? Patient states he has had CP for years, but over the past 1-2 months the pain has become worse  4. Is your CP continuous or coming and going? Continuous   5. Have you taken Nitroglycerin? No  ?

## 2020-11-21 NOTE — Telephone Encounter (Signed)
Pt with h/o CP over the last couple of years.  Over the last 2 months has started to notice it worsening and being more consistent.  Denies crushing CP.  States it's more of a discomfort versus a pain.  Occurs only in the chest, doesn't radiate anywhere.  Previously took Gabapentin but has since been weaned off of this and was not having issues.  At times the discomfort keeps him up at night.  Occasional SOB but attributes it to not being very active and weight gain.  Denies swelling, dizziness, lightheadedness or other issues.  States BP has been creeping up with DBP getting over 100 at times.  HR 90-100.  Nothing makes the discomfort worse or better.  Advised I will send to Dr. Lovena Le for review.

## 2020-11-23 MED ORDER — METOPROLOL TARTRATE 100 MG PO TABS: ORAL_TABLET | ORAL | 0 refills | Status: DC

## 2020-11-23 NOTE — Telephone Encounter (Addendum)
Per Dr. Lovena Le- Order cardiac CT with FFR to evaluate chest pain  If cardiac CT abnormal refer to general cardiology  mychart message sent to Pt.

## 2020-11-25 NOTE — Telephone Encounter (Signed)
Cardiac CT is ordered.  Pt instruction sent via mychart.  Will need BMP when CT is ordered.  Work up complete

## 2020-11-30 NOTE — Progress Notes (Deleted)
PCP:  [name], MD Primary Cardiologist: No primary care provider on file. Electrophysiologist: Cristopher Peru, MD   Timothy Rivers is a 35 y.o. male seen today for Cristopher Peru, MD for acute visit due to chest pains.  Since last being seen in our clinic the patient reports doing ***.  he denies chest pain, palpitations, dyspnea, PND, orthopnea, nausea, vomiting, dizziness, syncope, edema, weight gain, or early satiety.  Past Medical History:  Diagnosis Date  . Hernia, umbilical   . 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  Current Outpatient Medications  Medication Sig Dispense Refill  . doxycycline (VIBRAMYCIN) 100 MG capsule Take 1 capsule 2 x/day with food for 10 days 20 capsule 0  . metoprolol tartrate (LOPRESSOR) 100 MG tablet Take one tablet by mouth 2 hours prior to cardiac CT 1 tablet 0   No current facility-administered medications for this visit.    No Known Allergies  Social History   Socioeconomic History  . Marital status: Single    Spouse name: Not on file  . Number of children: Not on file  . Years of education: Not on file  . Highest education level: Not on file  Occupational History  . Not on file  Tobacco Use  . Smoking status: Former Smoker    Types: Cigars    Quit date: 08/28/2019    Years since quitting: 1.2  . Smokeless tobacco: Never Used  Vaping Use  . Vaping Use: Never used  Substance and Sexual Activity  . Alcohol use: Yes    Alcohol/week: 5.0 standard drinks    Types: 5 Cans of beer per week  . Drug use: Yes    Types: Marijuana  . Sexual activity: Yes  Other Topics Concern  . Not on file  Social History Narrative  . Not on file   Social Determinants of Health   Financial Resource Strain: Not on file  Food Insecurity: Not on file  Transportation Needs: Not on file  Physical Activity: Not on file  Stress: Not on file  Social Connections: Not on file  Intimate Partner Violence: Not on file     Review  of Systems: General: No chills, fever, night sweats or weight changes  Cardiovascular:  No chest pain, dyspnea on exertion, edema, orthopnea, palpitations, paroxysmal nocturnal dyspnea Dermatological: No rash, lesions or masses Respiratory: No cough, dyspnea Urologic: No hematuria, dysuria Abdominal: No nausea, vomiting, diarrhea, bright red blood per rectum, melena, or hematemesis Neurologic: No visual changes, weakness, changes in mental status All other systems reviewed and are otherwise negative except as noted above.  Physical Exam: There were no vitals filed for this visit.  GEN- The patient is well appearing, alert and oriented x 3 today.   HEENT: normocephalic, atraumatic; sclera clear, conjunctiva pink; hearing intact; oropharynx clear; neck supple, no JVP Lymph- no cervical lymphadenopathy Lungs- Clear to ausculation bilaterally, normal work of breathing.  No wheezes, rales, rhonchi Heart- Regular rate and rhythm, no murmurs, rubs or gallops, PMI not laterally displaced GI- soft, non-tender, non-distended, bowel sounds present, no hepatosplenomegaly Extremities- no clubbing, cyanosis, or edema; DP/PT/radial pulses 2+ bilaterally MS- no significant deformity or atrophy Skin- warm and dry, no rash or lesion Psych- euthymic mood, full affect Neuro- strength and sensation are intact  EKG is ordered. Personal review of EKG from today shows ***  Additional studies reviewed include: Previous EP office note, previous ETT, most recent labwork  Assessment and Plan:  1. Chest pressure, atypical Chronic  and intermittent He had a normal exercise tolerance test 07/2018 in the setting of the same. He is pending cardiac CT.  2. Abnormal EKG H/o WPW pattern which has resolved No syncope or tachypalpitations  3. HTN Continue to follow  Shirley Friar, PA-C  11/30/20 9:06 PM

## 2020-12-01 ENCOUNTER — Ambulatory Visit: Payer: Self-pay | Admitting: Student

## 2020-12-01 ENCOUNTER — Other Ambulatory Visit: Payer: Self-pay

## 2020-12-01 ENCOUNTER — Emergency Department (HOSPITAL_COMMUNITY): Payer: BC Managed Care – PPO

## 2020-12-01 ENCOUNTER — Encounter (HOSPITAL_COMMUNITY): Payer: Self-pay

## 2020-12-01 ENCOUNTER — Emergency Department (HOSPITAL_COMMUNITY)
Admission: EM | Admit: 2020-12-01 | Discharge: 2020-12-01 | Disposition: A | Discharge status: Home / Self Care | Payer: BC Managed Care – PPO | Attending: Emergency Medicine | Admitting: Emergency Medicine

## 2020-12-01 DIAGNOSIS — Z7982 Long term (current) use of aspirin: Secondary | ICD-10-CM | POA: Insufficient documentation

## 2020-12-01 DIAGNOSIS — Z8616 Personal history of COVID-19: Secondary | ICD-10-CM | POA: Insufficient documentation

## 2020-12-01 DIAGNOSIS — Z79899 Other long term (current) drug therapy: Secondary | ICD-10-CM | POA: Insufficient documentation

## 2020-12-01 DIAGNOSIS — Z20822 Contact with and (suspected) exposure to covid-19: Secondary | ICD-10-CM | POA: Insufficient documentation

## 2020-12-01 DIAGNOSIS — R002 Palpitations: Secondary | ICD-10-CM | POA: Diagnosis not present

## 2020-12-01 DIAGNOSIS — R000 Tachycardia, unspecified: Secondary | ICD-10-CM | POA: Diagnosis not present

## 2020-12-01 DIAGNOSIS — I10 Essential (primary) hypertension: Secondary | ICD-10-CM | POA: Insufficient documentation

## 2020-12-01 DIAGNOSIS — R0789 Other chest pain: Secondary | ICD-10-CM | POA: Insufficient documentation

## 2020-12-01 DIAGNOSIS — F17290 Nicotine dependence, other tobacco product, uncomplicated: Secondary | ICD-10-CM | POA: Insufficient documentation

## 2020-12-01 LAB — CBC
HCT: 57 % — ABNORMAL HIGH (ref 39.0–52.0)
Hemoglobin: 20.4 g/dL — ABNORMAL HIGH (ref 13.0–17.0)
MCH: 33.2 pg (ref 26.0–34.0)
MCHC: 35.8 g/dL (ref 30.0–36.0)
MCV: 92.7 fL (ref 80.0–100.0)
Platelets: 350 10*3/uL (ref 150–400)
RBC: 6.15 MIL/uL — ABNORMAL HIGH (ref 4.22–5.81)
RDW: 12 % (ref 11.5–15.5)
WBC: 9.9 10*3/uL (ref 4.0–10.5)
nRBC: 0 % (ref 0.0–0.2)

## 2020-12-01 LAB — BASIC METABOLIC PANEL
Anion gap: 17 — ABNORMAL HIGH (ref 5–15)
BUN: 15 mg/dL (ref 6–20)
CO2: 18 mmol/L — ABNORMAL LOW (ref 22–32)
Calcium: 10.1 mg/dL (ref 8.9–10.3)
Chloride: 97 mmol/L — ABNORMAL LOW (ref 98–111)
Creatinine, Ser: 1.4 mg/dL — ABNORMAL HIGH (ref 0.61–1.24)
GFR, Estimated: >60 mL/min (ref >60)
Glucose, Bld: 151 mg/dL — ABNORMAL HIGH (ref 70–99)
Potassium: 3.9 mmol/L (ref 3.5–5.1)
Sodium: 132 mmol/L — ABNORMAL LOW (ref 135–145)

## 2020-12-01 LAB — TSH: TSH: 1.589 u[IU]/mL (ref 0.350–4.500)

## 2020-12-01 LAB — TROPONIN I (HIGH SENSITIVITY): Troponin I (High Sensitivity): 4 ng/L (ref <18)

## 2020-12-01 LAB — RESP PANEL BY RT-PCR (FLU A&B, COVID) ARPGX2
Influenza A by PCR: NEGATIVE
Influenza B by PCR: NEGATIVE
SARS Coronavirus 2 by RT PCR: NEGATIVE

## 2020-12-01 MED ORDER — SODIUM CHLORIDE 0.9 % IV BOLUS
1000.0000 mL | Freq: Once | INTRAVENOUS | Status: AC
  Administered 2020-12-01: 1000 mL via INTRAVENOUS

## 2020-12-01 NOTE — ED Triage Notes (Signed)
Patient c/o chest pain at 0630 today and states his HR was 175-185. Patient states a history of WPW.

## 2020-12-01 NOTE — ED Notes (Signed)
Patient provided with water per request 

## 2020-12-01 NOTE — ED Notes (Signed)
Patient transported to X-ray 

## 2020-12-01 NOTE — Discharge Instructions (Signed)
It appears that your rapid heartbeat and discomfort in your chest was related to a combination of factors.  The blood tests indicate that you are mildly dehydrated.  Try to drink extra fluids, and make sure you are eating 3 meals a day.  Since you have scheduled a cardiac CT, follow-up for that on Monday as scheduled.  We sent a TSH off to check for thyroid disease.  Please discuss these results with your primary care doctor or cardiologist.  They will be available on MyChart later this afternoon.

## 2020-12-01 NOTE — ED Provider Notes (Signed)
[facility] DEPT Provider Note   CSN: [id number] Arrival date & time: 12/01/20  0354     History Chief Complaint  Patient presents with  . Chest Pain    Timothy Rivers is a 35 y.o. male.  HPI Patient seen by me at 7:33 AM.  He presents for evaluation of rapid heartbeat noticed while he was in the shower this morning.  He has been otherwise well.  He denies active ongoing chest pain although has had chest pain on and off for many years.  He feels somewhat short of breath at this time.  He denies recent fever, cough, nausea or vomiting.  He states he had Covid, tested positive with a home rapid test, about 6 weeks ago.  The patient contacted his cardiologist, 10 days ago, regarding ongoing chest pain.  Based on that his cardiologist ordered a cardiac CT.  Apparently there are preauthorization issues that have not been surmounted yet.  He has continued to have chest pain during this interim.  There are no other known active modifying factors.    Past Medical History:  Diagnosis Date  . Hernia, umbilical   . 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 Abscess of skin of neck 06/28/2020  . Overweight (BMI 25.0-29.9) 06/14/2020  . Elevated LFTs 04/12/2020  . 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 Chest wall pain 05/09/2017  . 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 Lipids abnormal 02/01/2016  . Other abnormal glucose 02/01/2016  . Vitamin D deficiency 02/01/2016  . Medication management 02/01/2016    History reviewed. No pertinent surgical history.     Family History  Problem Relation Age of Onset  . Diabetes Maternal Aunt   . Diabetes Maternal Uncle   . Cancer Maternal Grandfather     Social History   Tobacco Use  . Smoking status: Current Some Day Smoker    Types: Cigars    Last attempt to quit: 08/28/2019    Years since quitting: 1.2  . Smokeless tobacco: Never Used  Vaping Use  . Vaping Use: Never used  Substance  Use Topics  . Alcohol use: Yes    Alcohol/week: 5.0 standard drinks    Types: 5 Cans of beer per week  . Drug use: Not Currently    Types: Marijuana    Home Medications Prior to Admission medications   Medication Sig Start Date End Date Taking? Authorizing Provider  aspirin EC 81 MG tablet Take 81 mg by mouth daily. Swallow whole.   Yes [provider]  metoprolol tartrate (LOPRESSOR) 100 MG tablet Take one tablet by mouth 2 hours prior to cardiac CT Patient not taking: Reported on 12/01/2020 11/23/20   Evans Lance, MD    Allergies    Patient has no known allergies.  Review of Systems   Review of Systems  All other systems reviewed and are negative.   Physical Exam Updated Vital Signs BP (!) 142/100   Pulse (!) 101   Temp 97.7 F (36.5 C) (Oral)   Resp 16   Ht 6\' 1"  (1.854 m)   Wt 90.7 kg   SpO2 96%   BMI 26.39 kg/m   Physical Exam Vitals and nursing note reviewed.  Constitutional:      Appearance: He is well-developed.  HENT:     Head: Normocephalic and atraumatic.     Right Ear: External ear normal.     Left Ear: External ear normal.  Nose: No congestion or rhinorrhea.  Eyes:     Conjunctiva/sclera: Conjunctivae normal.     Pupils: Pupils are equal, round, and reactive to light.  Neck:     Trachea: Phonation normal.  Cardiovascular:     Rate and Rhythm: Regular rhythm. Tachycardia present.     Heart sounds: Normal heart sounds.  Pulmonary:     Effort: Pulmonary effort is normal.     Breath sounds: Normal breath sounds.     Comments: Oxygenation 98% on room air. Abdominal:     General: Bowel sounds are normal. There is no distension.     Palpations: Abdomen is soft.     Tenderness: There is no abdominal tenderness.  Musculoskeletal:        General: Normal range of motion.     Cervical back: Normal range of motion and neck supple.  Skin:    General: Skin is warm and dry.  Neurological:     Mental Status: He is alert and oriented to  person, place, and time.     Cranial Nerves: No cranial nerve deficit.     Sensory: No sensory deficit.     Motor: No abnormal muscle tone.     Coordination: Coordination normal.  Psychiatric:        Mood and Affect: Mood normal.        Behavior: Behavior normal.        Thought Content: Thought content normal.        Judgment: Judgment normal.     ED Results / Procedures / Treatments   Labs (all labs ordered are listed, but only abnormal results are displayed) Labs Reviewed  BASIC METABOLIC PANEL - Abnormal; Notable for the following components:      Result Value   Sodium 132 (*)    Chloride 97 (*)    CO2 18 (*)    Glucose, Bld 151 (*)    Creatinine, Ser 1.40 (*)    Anion gap 17 (*)    All other components within normal limits  CBC - Abnormal; Notable for the following components:   RBC 6.15 (*)    Hemoglobin 20.4 (*)    HCT 57.0 (*)    All other components within normal limits  RESP PANEL BY RT-PCR (FLU A&B, COVID) ARPGX2  TSH  TROPONIN I (HIGH SENSITIVITY)    EKG EKG Interpretation  Date/Time:  Thursday December 01 2020 07:29:08 EDT Ventricular Rate:  155 PR Interval:  87 QRS Duration: 110 QT Interval:  311 QTC Calculation: 500 R Axis:   7 Text Interpretation: Sinus tachycardia Low voltage, extremity leads Nonspecific T abnormalities, lateral leads Prolonged QT interval Baseline wander in lead(s) II aVF 12 Lead; Mason-Likar No old tracing to compare Confirmed by Daleen Bo [phone]) on 12/01/2020 7:50:40 AM   Radiology DG Chest 2 View  Result Date: 12/01/2020 CLINICAL DATA:  Chest pain. EXAM: CHEST - 2 VIEW COMPARISON:  05/17/2020. FINDINGS: Mediastinum and hilar structures normal. Lungs are clear. No pleural effusion or pneumothorax. Stable mild elevation left hemidiaphragm. Heart size normal. Mild thoracic spine scoliosis. No acute bony abnormality identified. IMPRESSION: No acute cardiopulmonary disease. Electronically Signed   By: Marcello Moores  Register   On:  12/01/2020 08:35    Procedures Procedures   Medications Ordered in ED Medications  sodium chloride 0.9 % bolus 1,000 mL (0 mLs Intravenous Stopped 12/01/20 1005)    ED Course  I have reviewed the triage vital signs and the nursing notes.  Pertinent labs & imaging results that  were available during my care of the patient were reviewed by me and considered in my medical decision making (see chart for details).  Clinical Course as of 12/02/20 0917  Thu Dec 01, 2020  0807 Heart rate is now 102 with IV fluid bolus infusing.  I talked to the patient about vagal maneuvers to lower heart rate, and he states he has previously been instructed on them, by his electrophysiologist. [EW]  1309 I discussed the case with Dr. Radford Pax, we reviewed his EKG together.  She states EKG indicates sinus tachycardia, not WPW.  She commend checking thyroid level, and follow-up with cardiology as planned. [EW]    Clinical Course User Index [EW] Daleen Bo, MD   MDM Rules/Calculators/A&P                           Patient Vitals for the past 24 hrs:  BP Pulse Resp SpO2  12/01/20 1415 (!) 142/100 (!) 101 16 96 %  12/01/20 1400 (!) 119/99 (!) 105 14 97 %  12/01/20 1345 (!) 134/99 96 17 98 %  12/01/20 1330 (!) 126/94 90 16 96 %  12/01/20 1315 (!) 124/99 88 18 96 %  12/01/20 1300 (!) 124/99 97 16 96 %  12/01/20 1245 (!) 127/97 96 19 96 %  12/01/20 1230 (!) 126/99 (!) 102 18 97 %  12/01/20 1219 (!) 127/107 (!) 105 19 97 %  12/01/20 1215 (!) 126/97 (!) 106 (!) 32 95 %  12/01/20 1200 -- (!) 118 14 96 %  12/01/20 1145 (!) 125/98 (!) 109 16 96 %  12/01/20 1130 (!) 134/103 (!) 111 (!) 23 98 %  12/01/20 1115 (!) 123/97 (!) 105 13 95 %  12/01/20 1100 (!) 122/96 (!) 105 19 95 %  12/01/20 1045 (!) 124/96 100 15 96 %  12/01/20 1030 (!) 116/106 98 (!) 23 97 %  12/01/20 1015 (!) 120/101 (!) 107 19 96 %  12/01/20 1000 (!) 127/99 (!) 105 17 95 %  12/01/20 0945 (!) 133/102 (!) 110 15 96 %  12/01/20 0930 (!)  129/105 (!) 112 15 96 %    At time of discharge-reevaluation with update and discussion. After initial assessment and treatment, an updated evaluation reveals he is comfortable with normal heart rate.  Findings discussed and questions answered. Daleen Bo   Medical Decision Making:  This patient is presenting for evaluation of palpitation and chest discomfort, which does require a range of treatment options, and is a complaint that involves a high risk of morbidity and mortality. The differential diagnoses include cardiac arrhythmia, WPW, metabolic disorder. I decided to review old records, and in summary middle-aged male presenting with sensation of palpitations with history of WPW.  I did not require additional historical information from anyone.  Clinical Laboratory Tests Ordered, included CBC, Metabolic panel and Troponin testing, Covid test, flu test. Review indicates findings consistent with dehydration, elevated.. Radiologic Tests Ordered, included chest x-ray.  I independently Visualized: Radiographic images, which show no infiltrate or edema  Cardiac Monitor Tracing which shows sinus tachycardia    Critical Interventions-clinical evaluation, laboratory testing, radiography, IV fluids, discussion of vagal maneuvers, observation and reassessment  After These Interventions, the Patient was reevaluated and was found with normalized heart rate following IV fluids and observation.  Troponin negative.  EKG reassuring.  No indication for acute unstable cardiac syndrome.  Case was discussed with cardiologist plans for ongoing management as an outpatient.  Patient now has a  outpatient CT scan scheduled for 4 days from now.  CRITICAL CARE-no Performed by: Daleen Bo  Nursing Notes Reviewed/ Care Coordinated Applicable Imaging Reviewed Interpretation of Laboratory Data incorporated into ED treatment  The patient appears reasonably screened and/or stabilized for discharge and I doubt  any other medical condition or other [facility] requiring further screening, evaluation, or treatment in the ED at this time prior to discharge.  Plan: Home Medications-continue usual; Home Treatments-rest, increase oral fluids; return here if the recommended treatment, does not improve the symptoms; Recommended follow up-cardiology as scheduled     Final Clinical Impression(s) / ED Diagnoses Final diagnoses:  Tachycardia    Rx / DC Orders ED Discharge Orders    None       Daleen Bo, MD 12/02/20 [phone]

## 2020-12-02 ENCOUNTER — Telehealth (HOSPITAL_COMMUNITY): Payer: Self-pay | Admitting: Emergency Medicine

## 2020-12-02 NOTE — Telephone Encounter (Signed)
Reaching out to patient to offer assistance regarding upcoming cardiac imaging study; pt verbalizes understanding of appt date/time, parking situation and where to check in, pre-test NPO status and medications ordered, and verified current allergies; name and call back number provided for further questions should they arise Marchia Bond RN Navigator Cardiac [facility] [phone] office [phone] cell   Pt does not have metoprolol to take prior to scan. I called his pharm to confirm the rx is available- no one answered. I left a detailed message with callback number if they have questions for me  Clarise Cruz

## 2020-12-05 ENCOUNTER — Other Ambulatory Visit: Payer: Self-pay

## 2020-12-05 ENCOUNTER — Ambulatory Visit (HOSPITAL_COMMUNITY)
Admission: RE | Admit: 2020-12-05 | Discharge: 2020-12-05 | Disposition: A | Discharge status: Home / Self Care | Payer: BC Managed Care – PPO | Source: Ambulatory Visit | Attending: Internal Medicine | Admitting: Internal Medicine

## 2020-12-05 ENCOUNTER — Encounter (HOSPITAL_COMMUNITY): Payer: Self-pay

## 2020-12-05 DIAGNOSIS — R072 Precordial pain: Secondary | ICD-10-CM | POA: Insufficient documentation

## 2020-12-05 MED ORDER — IOHEXOL 350 MG/ML SOLN
80.0000 mL | Freq: Once | INTRAVENOUS | Status: AC | PRN
  Administered 2020-12-05: 80 mL via INTRAVENOUS

## 2020-12-05 MED ORDER — NITROGLYCERIN 0.4 MG SL SUBL
0.8000 mg | SUBLINGUAL_TABLET | Freq: Once | SUBLINGUAL | Status: AC
  Administered 2020-12-05: 0.8 mg via SUBLINGUAL

## 2020-12-05 MED ORDER — NITROGLYCERIN 0.4 MG SL SUBL
SUBLINGUAL_TABLET | SUBLINGUAL | Status: AC
  Filled 2020-12-05: qty 2

## 2020-12-05 MED ORDER — METOPROLOL TARTRATE 5 MG/5ML IV SOLN
INTRAVENOUS | Status: AC
  Administered 2020-12-05: 5 mg via INTRAVENOUS
  Filled 2020-12-05: qty 5

## 2020-12-05 NOTE — Progress Notes (Addendum)
Cardiology Office Note Date:  12/05/2020  Patient ID:  Timothy Rivers, DOB [birth year], MRN [id number] PCP:  [name], MD  Electrophysiologist: Dr. 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 Lovena Le, last seen by him Oct 2021, doing well, no symptoms of SVT, no exertional intolerances.  Described his WPW pattern as intermittent, not present at that visit, planned for watchful waiting.  Mentioned BP was down, had lost some weight, encouraged to continue and reduce ETOH as well.  Had and EP visit at [facility] 12/01/20 with CP, palpitations. ER elicited some degree of chronicity to his CP though with uptick in symptoms of late, via our office was planed for CT though insurance issues had delayed. EKG noted rate 155, was reviewed with cardiology, Dr. Radford Pax with ST, no findings of WPW. Treated with IVF with improvement in his HR and planned to keep cardiology follow up outpt  LABS resp panel neg TSH 1.589 K+ 3.9 BUN/Creat 15/1.40 HS Trop 4 WBC 9.9 H/H 20/57 Plts 350  Coronary CT done 4/4 noted no obstructive CAD with mis LAD lesion <50% No incidental noncardiac findings noted on over read.  TODAY He reports that the day he went to the ER he was in the shower felt sudden onset of palpitations and heart racing developed some L arm numbness perhaps and jaw discomfort, and went to be checked. He reports years of CP every day in a constant aching type discomfort, though in the last month seems different, particularly feels like any time he does anything even just standing up or casual walking himself even minimally his HR shoots up, noting this by his watch, not necessarily by symptoms.  No near syncope or syncope No SOB  He is very worried about his CT findings and we discussed this at length today  He reports minimal ETOH intake, adjusted his eating, though did not think he ever  really ate terribly     Past Medical History:  Diagnosis Date  . Hernia, umbilical   . 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  Current Outpatient Medications  Medication Sig Dispense Refill  . aspirin EC 81 MG tablet Take 81 mg by mouth daily. Swallow whole.    . metoprolol tartrate (LOPRESSOR) 100 MG tablet Take one tablet by mouth 2 hours prior to cardiac CT (Patient not taking: Reported on 12/01/2020) 1 tablet 0   No current facility-administered medications for this visit.    Allergies:   Patient has no known allergies.   Social History:  The patient  reports that he has been smoking cigars. He has never used smokeless tobacco. He reports current alcohol use of about 5.0 standard drinks of alcohol per week. He reports previous drug use. Drug: Marijuana.   Family History:  The patient's family history includes Cancer in his maternal grandfather; Diabetes in his maternal aunt and maternal uncle.  ROS:  Please see the history of present illness.    All other systems are reviewed and otherwise negative.   PHYSICAL EXAM:  VS:  There were no vitals taken for this visit. BMI: There is no height or weight on file to calculate BMI. Well nourished, well developed, in no acute distress HEENT: normocephalic, atraumatic Neck: no JVD, carotid bruits or masses Cardiac:  RRR; no significant murmurs, no rubs, or gallops Lungs:  CTA b/l,  no wheezing, rhonchi or rales Abd: soft, nontender MS: no deformity or atrophy Ext:  no edema Skin: warm and dry, no rash Neuro:  No gross deficits appreciated Psych: euthymic mood, full affect   EKG:  Done today and reviewed by myself shows  SR 90bpm, , no ST/T changes, no pre-exciation 12/01/20: ST 155, no pre-excitation   12/05/2020: Coronary CT FINDINGS: Non-cardiac: See separate report from [facility].  Pulmonary veins drain normally to the left atrium. No LA appendage thrombus.  Calcium Score: 9.54  Agatston units.  Coronary Arteries: Right dominant with no anomalies  LM: No plaque or stenosis.  LAD system: Mixed plaque proximal LAD, mild (<50%) stenosis.  Circumflex system: No plaque or stenosis.  RCA system: No plaque or stenosis.  IMPRESSION: 1. Coronary artery calcium score 9.54 Agatston units. The places the patient in > 82nd percentile for age and gender (MESA score only validated for age 35 and above), suggesting high risk for future cardiac events.  2.  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  Radiology over read IMPRESSION: 1. No significant incidental noncardiac findings are noted.   Recent Labs: 05/17/2020: ALT 215; Magnesium 2.0 12/01/2020: BUN 15; Creatinine, Ser 1.40; Hemoglobin 20.4; Platelets 350; Potassium 3.9; Sodium 132; TSH 1.589  No results found for requested labs within last 8760 hours.   Estimated Creatinine Clearance: 83.2 mL/min (A) (by C-G formula based on SCr of 1.4 mg/dL (H)).   Wt Readings from Last 3 Encounters:  12/01/20 200 lb (90.7 kg)  07/06/20 216 lb (98 kg)  06/28/20 217 lb (98.4 kg)     Other studies reviewed: Additional studies/records reviewed today include: summarized above  ASSESSMENT AND PLAN:  1. WPW     Not appreciated on last few EKGs at least  2. CP     NOD LAD disease by CT     Non-cardiac      He appears somewhat anxious We discussed at length today his CT findings and importance for risk reduction  His l;ast couple LFTs have been elevated He assures minimal ETOH, previous perhaps was more He will come in for fasting lipids, LFTs' Pending LFTs will plan for atorvastatin He is extremely concerned about his cardiac status, will get a baseline echo for him as well  3.  ST      Unremarkable orthostatics today      Discussed with Dr. Lovena Le, will go ahead and start low doe metprolol succ 25mg  daily    Disposition: F/u on labs and back in clinic in 32mo, sooner if needed  Current medicines are  reviewed at length with the patient today.  The patient did not have any concerns regarding medicines.  Venetia Night, PA-C 12/05/2020 8:38 PM     [address] [place] [address] [phone] (office)  [phone] (fax)

## 2020-12-07 ENCOUNTER — Ambulatory Visit: Payer: BC Managed Care – PPO | Admitting: Physician Assistant

## 2020-12-07 ENCOUNTER — Other Ambulatory Visit: Payer: Self-pay

## 2020-12-07 ENCOUNTER — Encounter: Payer: Self-pay | Admitting: Physician Assistant

## 2020-12-07 VITALS — BP 122/88 | HR 92 | Ht 73.0 in | Wt 208.0 lb

## 2020-12-07 DIAGNOSIS — R002 Palpitations: Secondary | ICD-10-CM | POA: Diagnosis not present

## 2020-12-07 DIAGNOSIS — R079 Chest pain, unspecified: Secondary | ICD-10-CM

## 2020-12-07 DIAGNOSIS — I456 Pre-excitation syndrome: Secondary | ICD-10-CM

## 2020-12-07 DIAGNOSIS — I2510 Atherosclerotic heart disease of native coronary artery without angina pectoris: Secondary | ICD-10-CM | POA: Diagnosis not present

## 2020-12-07 MED ORDER — METOPROLOL SUCCINATE ER 25 MG PO TB24: 50.0000 mg | ORAL_TABLET | Freq: Every day | ORAL | 3 refills | Status: AC

## 2020-12-07 NOTE — Patient Instructions (Signed)
Medication Instructions:   START  METOPROLOL SUCCINATE  25 MG ONCE A DAY   *If you need a refill on your cardiac medications before your next appointment, please call your pharmacy*   Lab Work:  LFT AND LIPIDS TODAY   If you have labs (blood work) drawn today and your tests are completely normal, you will receive your results only by: Marland Kitchen MyChart Message (if you have MyChart) OR . A paper copy in the mail If you have any lab test that is abnormal or we need to change your treatment, we will call you to review the results.   Testing/Procedures: Your physician has requested that you have an echocardiogram. Echocardiography is a painless test that uses sound waves to create images of your heart. It provides your doctor with information about the size and shape of your heart and how well your heart's chambers and valves are working. This procedure takes approximately one hour. There are no restrictions for this procedure.    Follow-Up: At [facility], you and your health needs are our priority.  As part of our continuing mission to provide you with exceptional heart care, we have created designated Provider Care Teams.  These Care Teams include your primary Cardiologist (physician) and Advanced Practice Providers (APPs -  Physician Assistants and Nurse Practitioners) who all work together to provide you with the care you need, when you need it.  We recommend signing up for the patient portal called "MyChart".  Sign up information is provided on this After Visit Summary.  MyChart is used to connect with patients for Virtual Visits (Telemedicine).  Patients are able to view lab/test results, encounter notes, upcoming appointments, etc.  Non-urgent messages can be sent to your provider as well.   To learn more about what you can do with MyChart, go to [email or web address].    Your next appointment:   3 month(s)  The format for your next appointment:   In Person  Provider:   You may see  Cristopher Peru, MD or one of the following Advanced Practice Providers on your designated Care Team:    Chanetta Marshall, NP  Tommye Standard, PA-C  Legrand Como "Oda Kilts, [place]    Other Instructions

## 2020-12-14 ENCOUNTER — Other Ambulatory Visit: Payer: BC Managed Care – PPO

## 2021-01-06 ENCOUNTER — Other Ambulatory Visit (HOSPITAL_COMMUNITY): Payer: BC Managed Care – PPO

## 2021-01-06 ENCOUNTER — Other Ambulatory Visit: Payer: BC Managed Care – PPO

## 2021-02-02 ENCOUNTER — Other Ambulatory Visit (HOSPITAL_COMMUNITY): Payer: BC Managed Care – PPO

## 2021-02-24 ENCOUNTER — Telehealth: Payer: Self-pay | Admitting: *Deleted

## 2021-02-24 NOTE — Telephone Encounter (Signed)
   [facility] Pre-operative Risk Assessment    Patient Name: Timothy Rivers  DOB: [birth year]  MRN: [id number]   HEARTCARE STAFF: - Please ensure there is not already an duplicate clearance open for this procedure. - Under Visit Info/Reason for Call, type in Other and utilize the format Clearance MM/DD/YY or Clearance TBD. Do not use dashes or single digits. - If request is for dental extraction, please clarify the # of teeth to be extracted. - If the patient is currently at the dentist's office, call Pre-Op APP to address. If the patient is not currently in the dentist office, please route to the Pre-Op pool  Request for surgical clearance:  What type of surgery is being performed? L4-5 LUMBAR MICRODISKECTOMY    When is this surgery scheduled? 03/08/21   What type of clearance is required (medical clearance vs. Pharmacy clearance to hold med vs. Both)? MEDICAL  Are there any medications that need to be held prior to surgery and how long? NONE LISTED   Practice name and name of physician performing surgery? [facility]; DR. Sherley Bounds   What is the office phone number? [phone]   7.   What is the office fax number? [phone] ATTN: VANESSA x [phone]  8.   Anesthesia type (None, local, MAC, general) ? GENERAL   Julaine Hua 02/24/2021, 3:33 PM  _________________________________________________________________   (provider comments below)

## 2021-02-24 NOTE — Telephone Encounter (Signed)
   Primary Cardiologist: Cristopher Peru, MD  Chart reviewed as part of pre-operative protocol coverage. Given past medical history and time since last visit, based on ACC/AHA guidelines, Timothy Rivers would be at acceptable risk for the planned procedure without further cardiovascular testing.   His RCRI is a class II risk, 0.9% risk of major cardiac event.  I will route this recommendation to the requesting party via Epic fax function and remove from pre-op pool.  Please call with questions.  Jossie Ng. Kendrick Haapala NP-C    02/24/2021, [facility] [address] Office [phone] Fax [phone]

## 2021-03-07 ENCOUNTER — Telehealth: Payer: Self-pay | Admitting: Internal Medicine

## 2021-03-07 NOTE — Telephone Encounter (Signed)
Patient called to reschedule his echo to October, because he is having back surgery. The order expires before then and would not allow me to schedule it that far out. He would like to know if he can have his echo in October instead due to his surgery.

## 2021-03-13 ENCOUNTER — Other Ambulatory Visit (HOSPITAL_COMMUNITY): Payer: BC Managed Care – PPO

## 2021-03-13 ENCOUNTER — Other Ambulatory Visit: Payer: BC Managed Care – PPO

## 2021-03-22 ENCOUNTER — Other Ambulatory Visit: Payer: Self-pay | Admitting: *Deleted

## 2021-03-22 DIAGNOSIS — R002 Palpitations: Secondary | ICD-10-CM

## 2021-03-22 DIAGNOSIS — R079 Chest pain, unspecified: Secondary | ICD-10-CM

## 2021-03-22 NOTE — Progress Notes (Signed)
Order put in scheduling notified

## 2021-03-24 ENCOUNTER — Ambulatory Visit: Payer: BC Managed Care – PPO | Admitting: Internal Medicine

## 2021-06-16 ENCOUNTER — Ambulatory Visit (HOSPITAL_COMMUNITY): Payer: BC Managed Care – PPO

## 2021-06-20 ENCOUNTER — Ambulatory Visit: Payer: BC Managed Care – PPO | Admitting: Internal Medicine

## 2021-07-10 ENCOUNTER — Other Ambulatory Visit: Payer: Self-pay

## 2021-07-10 ENCOUNTER — Other Ambulatory Visit: Payer: BC Managed Care – PPO | Admitting: *Deleted

## 2021-07-10 ENCOUNTER — Ambulatory Visit (HOSPITAL_COMMUNITY): Payer: BC Managed Care – PPO | Attending: Cardiology

## 2021-07-10 DIAGNOSIS — R072 Precordial pain: Secondary | ICD-10-CM

## 2021-07-10 DIAGNOSIS — R002 Palpitations: Secondary | ICD-10-CM

## 2021-07-10 DIAGNOSIS — R079 Chest pain, unspecified: Secondary | ICD-10-CM | POA: Diagnosis present

## 2021-07-10 LAB — HEPATIC FUNCTION PANEL
ALT: 23 IU/L (ref 0–44)
AST: 16 IU/L (ref 0–40)
Albumin: 4.5 g/dL (ref 4.0–5.0)
Alkaline Phosphatase: 55 IU/L (ref 44–121)
Bilirubin Total: 0.5 mg/dL (ref 0.0–1.2)
Bilirubin, Direct: 0.16 mg/dL (ref 0.00–0.40)
Total Protein: 6.9 g/dL (ref 6.0–8.5)

## 2021-07-10 LAB — BASIC METABOLIC PANEL
BUN/Creatinine Ratio: 11 (ref 9–20)
BUN: 11 mg/dL (ref 6–20)
CO2: 27 mmol/L (ref 20–29)
Calcium: 9.7 mg/dL (ref 8.7–10.2)
Chloride: 102 mmol/L (ref 96–106)
Creatinine, Ser: 1.01 mg/dL (ref 0.76–1.27)
Glucose: 100 mg/dL — ABNORMAL HIGH (ref 70–99)
Potassium: 4.8 mmol/L (ref 3.5–5.2)
Sodium: 139 mmol/L (ref 134–144)
eGFR: 99 mL/min/{1.73_m2} (ref >59)

## 2021-07-10 LAB — LIPID PANEL
Chol/HDL Ratio: 3.1 ratio (ref 0.0–5.0)
Cholesterol, Total: 160 mg/dL (ref 100–199)
HDL: 52 mg/dL (ref >39)
LDL Chol Calc (NIH): 94 mg/dL (ref 0–99)
Triglycerides: 70 mg/dL (ref 0–149)
VLDL Cholesterol Cal: 14 mg/dL (ref 5–40)

## 2021-07-10 LAB — ECHOCARDIOGRAM COMPLETE
Area-P 1/2: 2.21 cm2
S' Lateral: 3.5 cm

## 2021-07-13 ENCOUNTER — Other Ambulatory Visit: Payer: Self-pay

## 2021-07-13 ENCOUNTER — Other Ambulatory Visit: Payer: Self-pay | Admitting: Gastroenterology

## 2021-07-13 ENCOUNTER — Ambulatory Visit (INDEPENDENT_AMBULATORY_CARE_PROVIDER_SITE_OTHER): Payer: BC Managed Care – PPO | Admitting: Internal Medicine

## 2021-07-13 VITALS — BP 133/88 | HR 89 | Ht 73.0 in | Wt 208.0 lb

## 2021-07-13 DIAGNOSIS — I456 Pre-excitation syndrome: Secondary | ICD-10-CM | POA: Diagnosis not present

## 2021-07-13 DIAGNOSIS — R0989 Other specified symptoms and signs involving the circulatory and respiratory systems: Secondary | ICD-10-CM

## 2021-07-13 DIAGNOSIS — R109 Unspecified abdominal pain: Secondary | ICD-10-CM

## 2021-07-13 NOTE — Patient Instructions (Signed)
Medication Instructions:  Your physician recommends that you continue on your current medications as directed. Please refer to the Current Medication list given to you today.  Labwork: None ordered.  Testing/Procedures: None ordered.  Follow-Up: Your physician wants you to follow-up in: one year with Cristopher Peru, MD    Any Other Special Instructions Will Be Listed Below (If Applicable).  If you need a refill on your cardiac medications before your next appointment, please call your pharmacy.

## 2021-07-13 NOTE — Progress Notes (Signed)
Electrophysiology TeleHealth Note   Due to national recommendations of social distancing due to COVID 19, an audio/video telehealth visit is felt to be most appropriate for this patient at this time.  See MyChart message from today for the patient's consent to telehealth for [facility].   Date:  07/13/2021   ID:  Timothy Rivers, DOB Mar 06, 1986, MRN [id number]  Location: patient's home  Provider location: [address] [place]  Evaluation Performed: Follow-up visit  PCP:  [name], MD  Cardiologist:  Cristopher Peru, MD  Electrophysiologist:  Dr Lovena Le  Chief Complaint:  "I am doing ok."  History of Present Illness:    Timothy Rivers is a 35 y.o. male who presents via audio/video conferencing for a telehealth visit today.  Since last being seen in our clinic, the patient reports doing very well.  He had a visit to the ED with sinus tachycardia but was thought to be dehydrated. Today, he denies symptoms of palpitations, chest pain, shortness of breath,  lower extremity edema, dizziness, presyncope, or syncope.  The patient is otherwise without complaint today.  The patient denies symptoms of fevers, chills, cough, or new SOB worrisome for COVID 19.  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  Current Outpatient Medications  Medication Sig Dispense Refill   metoprolol succinate (TOPROL-XL) 25 MG 24 hr tablet Take 2 tablets (50 mg total) by mouth daily. Take with or immediately following a meal. 90 tablet 3   No current facility-administered medications for this visit.    Allergies:   Patient has no known allergies.   Social History:  The patient  reports that he has been smoking cigars. He has never used smokeless tobacco. He reports current alcohol use of about 5.0 standard drinks per week. He reports that he does not currently use drugs after having used the following drugs:  Marijuana.   Family History:  The patient's family history includes Cancer in his maternal grandfather; Diabetes in his maternal aunt and maternal uncle.   ROS:  Please see the history of present illness.   All other systems are personally reviewed and negative.    Exam:    Vital Signs:  BP 133/88   Pulse 89   Ht 6\' 1"  (1.854 m)   Wt 208 lb (94.3 kg)   BMI 27.44 kg/m   Well appearing, alert and conversant, regular work of breathing,  good skin color Eyes- anicteric, neuro- grossly intact, skin- no apparent rash or lesions or cyanosis, mouth- oral mucosa is pink   Labs/Other Tests and Data Reviewed:    Recent Labs: 12/01/2020: Hemoglobin 20.4; Platelets 350; TSH 1.589 07/10/2021: ALT 23; BUN 11; Creatinine, Ser 1.01; Potassium 4.8; Sodium 139   Wt Readings from Last 3 Encounters:  07/13/21 208 lb (94.3 kg)  12/07/20 208 lb (94.3 kg)  12/01/20 200 lb (90.7 kg)     Other studies personally reviewed: Additional studies/ records that were reviewed today include: none  Review of the above records today demonstrates: as above  ASSESSMENT & PLAN:    1.  WPW pattern - he has had no SVT or atrial fib. He will undergo watchful waiting. Avoid stimulants.  2. HTN - his bp has been stable lately. 3. Chest pressure - resolved.   Follow-up:  one year Next remote: n/a  Current medicines are reviewed at length with the patient today.  The patient does not have concerns regarding his medicines.  The following changes were made today:  none  Labs/ tests ordered today include: none No orders of the defined types were placed in this encounter.    Patient Risk:  after full review of this patients clinical status, I feel that they are at moderate risk at this time.  Today, I have spent 15 minutes with the patient with telehealth technology discussing all of the above .    Signed, Cristopher Peru, MD  07/13/2021 11:39 AM     [place] [address] [address] [phone] (office) [phone] (fax)

## 2021-07-18 ENCOUNTER — Ambulatory Visit
Admission: RE | Admit: 2021-07-18 | Discharge: 2021-07-18 | Disposition: A | Discharge status: Home / Self Care | Payer: BC Managed Care – PPO | Source: Ambulatory Visit | Attending: Gastroenterology | Admitting: Gastroenterology

## 2021-07-18 ENCOUNTER — Other Ambulatory Visit: Payer: Self-pay

## 2021-07-18 DIAGNOSIS — R109 Unspecified abdominal pain: Secondary | ICD-10-CM

## 2021-07-18 MED ORDER — IOPAMIDOL (ISOVUE-300) INJECTION 61%
100.0000 mL | Freq: Once | INTRAVENOUS | Status: AC | PRN
  Administered 2021-07-18: 100 mL via INTRAVENOUS

## 2022-03-02 ENCOUNTER — Encounter: Payer: Self-pay | Admitting: Adult Health

## 2022-03-02 ENCOUNTER — Ambulatory Visit (INDEPENDENT_AMBULATORY_CARE_PROVIDER_SITE_OTHER): Payer: BC Managed Care – PPO | Admitting: Adult Health

## 2022-03-02 VITALS — BP 144/104 | HR 93 | Temp 97.7°F | Wt 217.0 lb

## 2022-03-02 DIAGNOSIS — R109 Unspecified abdominal pain: Secondary | ICD-10-CM

## 2022-03-02 DIAGNOSIS — R10819 Abdominal tenderness, unspecified site: Secondary | ICD-10-CM

## 2022-03-02 MED ORDER — METHOCARBAMOL 500 MG PO TABS: 500.0000 mg | ORAL_TABLET | Freq: Four times a day (QID) | ORAL | 0 refills | Status: AC | PRN

## 2022-03-02 NOTE — Progress Notes (Signed)
Assessment and Plan:  Timothy Rivers was seen today for flank pain.  Diagnoses and all orders for this visit:  Right flank pain Abdominal tenderness in right flank Vague, mild; reports recurrent of similar last year, had extensive workup by GI excepting EGD/colonoscopy Benign CT that didn't show gallstones/kidney stones Presentation today is vague, consider that this could be MSK, however he would like to make sure gallbladder fully ruled out Will check [place], phone number given to schedule Will recheck basic abdominal organ functions No specific GI sx that suggest need for GI follow up If negative, suggested he try heat, muscle relaxer and evaluate if any improvement; would advise against narcotics, could try NSAIDs, consider PT with neurosurgeon approval  Follow up if any change in sx Please go to the ER if you have any severe AB pain, unable to hold down food/water, blood in stool or vomit, chest pain, shortness of breath -     CBC with Differential/Platelet -     COMPLETE METABOLIC PANEL WITH GFR -     Urinalysis w microscopic + reflex cultur -     US Abdomen Complete; Future  Other orders -     methocarbamol (ROBAXIN) 500 MG tablet; Take 1 tablet (500 mg total) by mouth every 6 (six) hours as needed for muscle spasms.  Further disposition pending results of labs. Discussed med's effects and SE's.   Over 30 minutes of exam, counseling, chart review, and critical decision making was performed.   Future Appointments  Date Time Provider [place]  03/07/2022 10:15 AM [facility] [place] [facility]    ------------------------------------------------------------------------------------------------------------------   HPI BP (!) 144/104   Pulse 93   Temp 97.7 F (36.5 C)   Wt 217 lb (98.4 kg)   SpO2 99%   BMI 28.63 kg/m  36 y.o.male presents for evaluation of R flank pain.   He reports sx began about 1-2 months ago, pressure, sense of "bowling ball" "like there is a mass in  my R side" mostly when he moves certain ways, twisting. Describes as vague, aching, occasionally sense of radiating around to abdomen, groin, but denies notable sx today.   He reports stools are bristol 5-6 at baseline, 1-2 times a day, chronic bloating, unchanged. Denies belching, gas, halitosis.  Takes famotidine regularly for reflux, well controlled   He reports similar sx last year, had workup by GI Dr. Collene Mares including CT scan that was unremarkable:   He had CT 07/18/2021 that showed  FINDINGS: Lower Chest: No acute findings. Hepatobiliary: No hepatic masses identified. Gallbladder is unremarkable. No evidence of biliary ductal dilatation. Pancreas:  No mass or inflammatory changes. Spleen: Within normal limits in size and appearance. Adrenals/Urinary Tract: No masses identified. No evidence of ureteral calculi or hydronephrosis. Stomach/Bowel: No evidence of obstruction, inflammatory process or abnormal fluid collections. Normal appendix visualized. Vascular/Lymphatic: No pathologically enlarged lymph nodes. No acute vascular findings. Reproductive:  No mass or other significant abnormality. Other:  None. Musculoskeletal:  No suspicious bone lesions identified.   IMPRESSION: Negative. No acute findings or other significant abnormality.   Notably he follows with [name] neurosurgery Dr. [name] for lower back pain, s/p microdiscectomy last year, has had mild persistent bil lumbar pain, reports has some leftover narcotic (oxycodone), took 1 which did help pain.    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 Take with or immediately following a meal.   No current facility-administered medications on file prior to visit.   Surgical History:   He  has no past surgical history on file. Family History:  Hisfamily history includes Cancer in his maternal grandfather; Diabetes in his maternal aunt and maternal uncle. Social History:   reports that he has been smoking cigars. He has never used smokeless tobacco. He reports current alcohol use of about 5.0 standard drinks of alcohol per week. He reports that he does not currently use drugs after having used the following drugs: Marijuana.   ROS: all negative except above.   Physical Exam:  BP (!) 144/104   Pulse 93   Temp 97.7 F (36.5 C)   Wt 217 lb (98.4 kg)   SpO2 99%   BMI 28.63 kg/m   General Appearance: Well nourished, in no apparent distress. Eyes: PERRLA, conjunctiva no swelling or erythema ENT/Mouth: No erythema, swelling, or exudate on post pharynx.  Tonsils not swollen or erythematous. Hearing normal.  Neck: Supple Respiratory: Respiratory effort normal, BS equal bilaterally without rales, rhonchi, wheezing or stridor.  Cardio: RRR with no MRGs. Brisk peripheral pulses without edema.  Abdomen: Soft, + BS x 4.  Mild R abdominal tenderness, non-localizing, non-tender deep RLQ, no guarding, rebound, hernias, masses. Lymphatics: Non tender without lymphadenopathy.  Musculoskeletal: normal gait, no midline spinous pain, no paraspinal tenderness. No spasms. Vague posterior lower chest wall, lower back/flank tenderness.  Skin: Warm, dry without rashes, lesions, ecchymosis.  Neuro: Normal muscle tone,  Psych: Awake and oriented X 3, normal affect, Insight and Judgment appropriate.     Izora Ribas, NP 12:17 PM [facility]

## 2022-03-02 NOTE — Patient Instructions (Addendum)
YOU CAN CALL TO MAKE AN ULTRASOUND..  I have put in an order for an ultrasound for you to have You can set them up at your convenience by calling this number [phone] You will likely have the ultrasound at [address]  If you have any issues call our office and we will set this up for you.      Unclear if this is a GI issue vs musculoskeletal  Try heat, muscle relaxer -  Hydrate well     Methocarbamol Tablets What is this medication? METHOCARBAMOL (meth oh KAR ba mole) treats muscle pain and stiffness. It works by calming overactive nerves in your body, which helps your muscles relax. It belongs to a group of medications called muscle relaxants. This medicine may be used for other purposes; ask your health care provider or pharmacist if you have questions. COMMON BRAND NAME(S): Robaxin What should I tell my care team before I take this medication? They need to know if you have any of these conditions: Kidney disease Seizures An unusual or allergic reaction to methocarbamol, other medications, foods, dyes, or preservatives Pregnant or trying to get pregnant Breast-feeding How should I use this medication? Take this medication by mouth with a full glass of water. Follow the directions on the prescription label. Take your medication at regular intervals. Do not take your medication more often than directed. Talk to your care team about the use of this medication in children. Special care may be needed. Overdosage: If you think you have taken too much of this medicine contact a poison control center or emergency room at once. NOTE: This medicine is only for you. Do not share this medicine with others. What if I miss a dose? If you miss a dose, take it as soon as you can. If it is almost time for your next dose, take only the next dose. Do not take double or extra doses. What may interact with this medication? Do not take this medication with any of the  following: Narcotic medications for cough This medication may also interact with the following: Alcohol Antihistamines for allergy, cough and cold Certain medications for anxiety or sleep Certain medications for depression like amitriptyline, fluoxetine, sertraline Certain medications for seizures like phenobarbital, primidone Cholinesterase inhibitors like neostigmine, ambenonium, and pyridostigmine bromide General anesthetics like halothane, isoflurane, methoxyflurane, propofol Local anesthetics like lidocaine, pramoxine, tetracaine Medications that relax muscles for surgery Narcotic medications for pain Phenothiazines like chlorpromazine, mesoridazine, prochlorperazine, thioridazine This list may not describe all possible interactions. Give your health care provider a list of all the medicines, herbs, non-prescription drugs, or dietary supplements you use. Also tell them if you smoke, drink alcohol, or use illegal drugs. Some items may interact with your medicine. What should I watch for while using this medication? Tell your care team if your symptoms do not start to get better or if they get worse. You may get drowsy or dizzy. Do not drive, use machinery, or do anything that needs mental alertness until you know how this medication affects you. Do not stand or sit up quickly, especially if you are an older patient. This reduces the risk of dizzy or fainting spells. Alcohol may interfere with the effect of this medication. Avoid alcoholic drinks. If you are taking another medication that also causes drowsiness, you may have more side effects. Give your care team a list of all medications you use. Your care team will tell you how much medication to  take. Do not take more medication than directed. Call emergency for help if you have problems breathing or unusual sleepiness. What side effects may I notice from receiving this medication? Side effects that you should report to your care team as  soon as possible: Allergic reactions--skin rash, itching, hives, swelling of the face, lips, tongue, or throat CNS depression--slow or shallow breathing, shortness of breath, feeling faint, dizziness, confusion, trouble staying awake Side effects that usually do not require medical attention (report to your care team if they continue or are bothersome): Dizziness Drowsiness Headache Metallic taste in mouth Upset stomach This list may not describe all possible side effects. Call your doctor for medical advice about side effects. You may report side effects to FDA at 1-800-FDA-1088. Where should I keep my medication? Keep out of the reach of children. Store at room temperature between 20 and 25 degrees C (68 and 77 degrees F). Keep container tightly closed. Throw away any unused medication after the expiration date. NOTE: This sheet is a summary. It may not cover all possible information. If you have questions about this medicine, talk to your doctor, pharmacist, or health care provider.  2023 [employer] (2020-12-23 00:00:00)

## 2022-03-03 LAB — URINALYSIS W MICROSCOPIC + REFLEX CULTURE
Bacteria, UA: NONE SEEN /HPF
Bilirubin Urine: NEGATIVE
Glucose, UA: NEGATIVE
Hgb urine dipstick: NEGATIVE
Hyaline Cast: NONE SEEN /LPF
Ketones, ur: NEGATIVE
Leukocyte Esterase: NEGATIVE
Nitrites, Initial: NEGATIVE
Protein, ur: NEGATIVE
Specific Gravity, Urine: 1.019 (ref 1.001–1.035)
Squamous Epithelial / HPF: NONE SEEN /HPF (ref <=5)
WBC, UA: NONE SEEN /HPF (ref 0–5)
pH: 8 (ref 5.0–8.0)

## 2022-03-03 LAB — NO CULTURE INDICATED

## 2022-03-03 LAB — COMPLETE METABOLIC PANEL WITH GFR
AG Ratio: 1.4 (calc) (ref 1.0–2.5)
ALT: 35 U/L (ref 9–46)
AST: 18 U/L (ref 10–40)
Albumin: 4.6 g/dL (ref 3.6–5.1)
Alkaline phosphatase (APISO): 48 U/L (ref 36–130)
BUN: 11 mg/dL (ref 7–25)
CO2: 28 mmol/L (ref 20–32)
Calcium: 9.6 mg/dL (ref 8.6–10.3)
Chloride: 102 mmol/L (ref 98–110)
Creat: 0.99 mg/dL (ref 0.60–1.26)
Globulin: 3.2 g/dL (calc) (ref 1.9–3.7)
Glucose, Bld: 87 mg/dL (ref 65–99)
Potassium: 4.5 mmol/L (ref 3.5–5.3)
Sodium: 138 mmol/L (ref 135–146)
Total Bilirubin: 0.7 mg/dL (ref 0.2–1.2)
Total Protein: 7.8 g/dL (ref 6.1–8.1)
eGFR: 101 mL/min/{1.73_m2} (ref >=60)

## 2022-03-03 LAB — CBC WITH DIFFERENTIAL/PLATELET
Absolute Monocytes: 772 cells/uL (ref 200–950)
Basophils Absolute: 109 cells/uL (ref 0–200)
Basophils Relative: 1.4 %
Eosinophils Absolute: 203 cells/uL (ref 15–500)
Eosinophils Relative: 2.6 %
HCT: 44.8 % (ref 38.5–50.0)
Hemoglobin: 15.5 g/dL (ref 13.2–17.1)
Lymphs Abs: 1942 cells/uL (ref 850–3900)
MCH: 31.8 pg (ref 27.0–33.0)
MCHC: 34.6 g/dL (ref 32.0–36.0)
MCV: 91.8 fL (ref 80.0–100.0)
MPV: 11.4 fL (ref 7.5–12.5)
Monocytes Relative: 9.9 %
Neutro Abs: 4774 cells/uL (ref 1500–7800)
Neutrophils Relative %: 61.2 %
Platelets: 234 10*3/uL (ref 140–400)
RBC: 4.88 10*6/uL (ref 4.20–5.80)
RDW: 12.2 % (ref 11.0–15.0)
Total Lymphocyte: 24.9 %
WBC: 7.8 10*3/uL (ref 3.8–10.8)

## 2022-03-07 ENCOUNTER — Ambulatory Visit
Admission: RE | Admit: 2022-03-07 | Discharge: 2022-03-07 | Disposition: A | Discharge status: Home / Self Care | Payer: BC Managed Care – PPO | Source: Ambulatory Visit | Attending: Adult Health | Admitting: Adult Health

## 2022-03-07 DIAGNOSIS — R10819 Abdominal tenderness, unspecified site: Secondary | ICD-10-CM

## 2022-03-07 DIAGNOSIS — R109 Unspecified abdominal pain: Secondary | ICD-10-CM

## 2022-03-08 ENCOUNTER — Encounter: Payer: Self-pay | Admitting: Adult Health

## 2022-03-08 ENCOUNTER — Other Ambulatory Visit: Payer: Self-pay | Admitting: Adult Health

## 2022-03-08 DIAGNOSIS — K8070 Calculus of gallbladder and bile duct without cholecystitis without obstruction: Secondary | ICD-10-CM

## 2022-03-08 DIAGNOSIS — N200 Calculus of kidney: Secondary | ICD-10-CM

## 2022-03-08 DIAGNOSIS — R109 Unspecified abdominal pain: Secondary | ICD-10-CM

## 2022-03-08 DIAGNOSIS — R10A1 Flank pain, right side: Secondary | ICD-10-CM

## 2022-03-08 DIAGNOSIS — K760 Fatty (change of) liver, not elsewhere classified: Secondary | ICD-10-CM

## 2022-03-11 ENCOUNTER — Ambulatory Visit
Admission: RE | Admit: 2022-03-11 | Discharge: 2022-03-11 | Disposition: A | Discharge status: Home / Self Care | Payer: BC Managed Care – PPO | Source: Ambulatory Visit | Attending: Adult Health | Admitting: Adult Health

## 2022-03-11 DIAGNOSIS — K8070 Calculus of gallbladder and bile duct without cholecystitis without obstruction: Secondary | ICD-10-CM

## 2022-03-11 DIAGNOSIS — R109 Unspecified abdominal pain: Secondary | ICD-10-CM

## 2022-03-11 MED ORDER — GADOBENATE DIMEGLUMINE 529 MG/ML IV SOLN
20.0000 mL | Freq: Once | INTRAVENOUS | Status: AC | PRN
  Administered 2022-03-11: 20 mL via INTRAVENOUS

## 2022-03-13 ENCOUNTER — Other Ambulatory Visit: Payer: Self-pay | Admitting: Adult Health

## 2022-03-13 ENCOUNTER — Encounter: Payer: Self-pay | Admitting: Adult Health

## 2022-03-13 DIAGNOSIS — R160 Hepatomegaly, not elsewhere classified: Secondary | ICD-10-CM | POA: Insufficient documentation

## 2022-03-13 DIAGNOSIS — D135 Benign neoplasm of extrahepatic bile ducts: Secondary | ICD-10-CM | POA: Insufficient documentation

## 2022-03-13 DIAGNOSIS — K8070 Calculus of gallbladder and bile duct without cholecystitis without obstruction: Secondary | ICD-10-CM

## 2022-03-22 ENCOUNTER — Ambulatory Visit (HOSPITAL_COMMUNITY)
Admission: RE | Admit: 2022-03-22 | Discharge: 2022-03-22 | Disposition: A | Discharge status: Home / Self Care | Payer: BC Managed Care – PPO | Source: Ambulatory Visit | Attending: Adult Health | Admitting: Adult Health

## 2022-03-22 DIAGNOSIS — K8070 Calculus of gallbladder and bile duct without cholecystitis without obstruction: Secondary | ICD-10-CM | POA: Diagnosis present

## 2022-03-22 MED ORDER — TECHNETIUM TC 99M MEBROFENIN IV KIT
5.0000 | PACK | Freq: Once | INTRAVENOUS | Status: AC | PRN
  Administered 2022-03-22: 5 via INTRAVENOUS

## 2022-03-28 ENCOUNTER — Telehealth: Payer: Self-pay | Admitting: Nurse Practitioner

## 2022-03-28 NOTE — Telephone Encounter (Signed)
Pt had imaging done last week and wanting to know the results please. Test were ordered by Caryl Pina but she wasn't able to result them before leaving

## 2022-04-02 ENCOUNTER — Telehealth: Payer: Self-pay | Admitting: Nurse Practitioner

## 2022-04-02 ENCOUNTER — Ambulatory Visit (HOSPITAL_COMMUNITY): Payer: BC Managed Care – PPO

## 2022-04-02 NOTE — Telephone Encounter (Signed)
Patient is returning your call from this past Friday. Please return his call when you can. [phone].

## 2022-06-18 ENCOUNTER — Encounter: Payer: Self-pay | Admitting: Nurse Practitioner

## 2022-09-04 ENCOUNTER — Ambulatory Visit: Payer: Self-pay | Admitting: Nurse Practitioner

## 2023-01-16 ENCOUNTER — Other Ambulatory Visit: Payer: Self-pay | Admitting: Internal Medicine

## 2023-01-16 DIAGNOSIS — T63441A Toxic effect of venom of bees, accidental (unintentional), initial encounter: Secondary | ICD-10-CM

## 2023-01-16 MED ORDER — EPINEPHRINE 0.3 MG/0.3ML IJ SOAJ: INTRAMUSCULAR | 0 refills | Status: AC
